# Patient Record
Sex: Female | Born: 1955 | Race: White | Hispanic: No | Marital: Married | Smoking: Current every day smoker
Health system: Southern US, Community
[De-identification: ages and names within clinical notes are randomized; demographics above are authoritative.]

## PROBLEM LIST (undated history)

## (undated) DIAGNOSIS — K219 Gastro-esophageal reflux disease without esophagitis: Secondary | ICD-10-CM

## (undated) DIAGNOSIS — J189 Pneumonia, unspecified organism: Secondary | ICD-10-CM

## (undated) DIAGNOSIS — E119 Type 2 diabetes mellitus without complications: Secondary | ICD-10-CM

## (undated) DIAGNOSIS — C801 Malignant (primary) neoplasm, unspecified: Secondary | ICD-10-CM

## (undated) DIAGNOSIS — T8859XA Other complications of anesthesia, initial encounter: Secondary | ICD-10-CM

## (undated) DIAGNOSIS — G709 Myoneural disorder, unspecified: Secondary | ICD-10-CM

## (undated) DIAGNOSIS — N189 Chronic kidney disease, unspecified: Secondary | ICD-10-CM

## (undated) DIAGNOSIS — M1990 Unspecified osteoarthritis, unspecified site: Secondary | ICD-10-CM

## (undated) DIAGNOSIS — I10 Essential (primary) hypertension: Secondary | ICD-10-CM

## (undated) DIAGNOSIS — Z87442 Personal history of urinary calculi: Secondary | ICD-10-CM

## (undated) DIAGNOSIS — R112 Nausea with vomiting, unspecified: Secondary | ICD-10-CM

## (undated) DIAGNOSIS — Z9889 Other specified postprocedural states: Secondary | ICD-10-CM

## (undated) HISTORY — PX: ABDOMINAL HYSTERECTOMY: SHX81

## (undated) HISTORY — PX: EYE SURGERY: SHX253

---

## 2008-03-22 HISTORY — PX: BREAST SURGERY: SHX581

## 2015-11-20 DIAGNOSIS — M189 Osteoarthritis of first carpometacarpal joint, unspecified: Secondary | ICD-10-CM | POA: Insufficient documentation

## 2015-11-20 DIAGNOSIS — L409 Psoriasis, unspecified: Secondary | ICD-10-CM | POA: Insufficient documentation

## 2015-11-20 DIAGNOSIS — M25441 Effusion, right hand: Secondary | ICD-10-CM | POA: Insufficient documentation

## 2016-11-03 DIAGNOSIS — Z79899 Other long term (current) drug therapy: Secondary | ICD-10-CM | POA: Insufficient documentation

## 2016-11-03 DIAGNOSIS — L4050 Arthropathic psoriasis, unspecified: Secondary | ICD-10-CM | POA: Insufficient documentation

## 2017-03-29 ENCOUNTER — Inpatient Hospital Stay: Payer: BC Managed Care – PPO

## 2017-03-29 ENCOUNTER — Inpatient Hospital Stay
Admission: EM | Admit: 2017-03-29 | Discharge: 2017-03-31 | DRG: 419 | Disposition: A | Discharge status: Home / Self Care | Payer: BC Managed Care – PPO | Attending: General Surgery | Admitting: General Surgery

## 2017-03-29 ENCOUNTER — Encounter: Payer: Self-pay | Admitting: Emergency Medicine

## 2017-03-29 ENCOUNTER — Other Ambulatory Visit: Payer: Self-pay

## 2017-03-29 ENCOUNTER — Emergency Department: Payer: BC Managed Care – PPO

## 2017-03-29 DIAGNOSIS — I10 Essential (primary) hypertension: Secondary | ICD-10-CM | POA: Diagnosis present

## 2017-03-29 DIAGNOSIS — Z79899 Other long term (current) drug therapy: Secondary | ICD-10-CM | POA: Diagnosis not present

## 2017-03-29 DIAGNOSIS — Z833 Family history of diabetes mellitus: Secondary | ICD-10-CM

## 2017-03-29 DIAGNOSIS — K8000 Calculus of gallbladder with acute cholecystitis without obstruction: Secondary | ICD-10-CM | POA: Diagnosis present

## 2017-03-29 DIAGNOSIS — E119 Type 2 diabetes mellitus without complications: Secondary | ICD-10-CM | POA: Diagnosis present

## 2017-03-29 DIAGNOSIS — K819 Cholecystitis, unspecified: Secondary | ICD-10-CM

## 2017-03-29 DIAGNOSIS — Z7984 Long term (current) use of oral hypoglycemic drugs: Secondary | ICD-10-CM | POA: Diagnosis not present

## 2017-03-29 HISTORY — DX: Type 2 diabetes mellitus without complications: E11.9

## 2017-03-29 HISTORY — DX: Essential (primary) hypertension: I10

## 2017-03-29 LAB — BASIC METABOLIC PANEL
ANION GAP: 11 (ref 5–15)
BUN: 12 mg/dL (ref 6–20)
CHLORIDE: 96 mmol/L — AB (ref 101–111)
CO2: 29 mmol/L (ref 22–32)
CREATININE: 0.7 mg/dL (ref 0.44–1.00)
Calcium: 9.5 mg/dL (ref 8.9–10.3)
GFR calc non Af Amer: >60 mL/min (ref >60)
Glucose, Bld: 224 mg/dL — ABNORMAL HIGH (ref 65–99)
Potassium: 3.1 mmol/L — ABNORMAL LOW (ref 3.5–5.1)
SODIUM: 136 mmol/L (ref 135–145)

## 2017-03-29 LAB — CBC
HCT: 40.8 % (ref 35.0–47.0)
HCT: 42.7 % (ref 35.0–47.0)
HEMOGLOBIN: 14.2 g/dL (ref 12.0–16.0)
Hemoglobin: 14.6 g/dL (ref 12.0–16.0)
MCH: 32.7 pg (ref 26.0–34.0)
MCH: 32.5 pg (ref 26.0–34.0)
MCHC: 34.8 g/dL (ref 32.0–36.0)
MCHC: 34.2 g/dL (ref 32.0–36.0)
MCV: 93.8 fL (ref 80.0–100.0)
MCV: 95.1 fL (ref 80.0–100.0)
PLATELETS: 227 10*3/uL (ref 150–440)
PLATELETS: 168 10*3/uL (ref 150–440)
RBC: 4.35 MIL/uL (ref 3.80–5.20)
RBC: 4.49 MIL/uL (ref 3.80–5.20)
RDW: 13.4 % (ref 11.5–14.5)
RDW: 13.8 % (ref 11.5–14.5)
WBC: 12.5 10*3/uL — AB (ref 3.6–11.0)
WBC: 15.8 10*3/uL — ABNORMAL HIGH (ref 3.6–11.0)

## 2017-03-29 LAB — CREATININE, SERUM
Creatinine, Ser: 0.55 mg/dL (ref 0.44–1.00)
GFR calc Af Amer: >60 mL/min (ref >60)
GFR calc non Af Amer: >60 mL/min (ref >60)

## 2017-03-29 LAB — HEPATIC FUNCTION PANEL
ALBUMIN: 3.7 g/dL (ref 3.5–5.0)
ALT: 32 U/L (ref 14–54)
AST: 31 U/L (ref 15–41)
Alkaline Phosphatase: 98 U/L (ref 38–126)
BILIRUBIN TOTAL: 0.6 mg/dL (ref 0.3–1.2)
Bilirubin, Direct: <0.1 mg/dL — ABNORMAL LOW (ref 0.1–0.5)
Total Protein: 6.4 g/dL — ABNORMAL LOW (ref 6.5–8.1)

## 2017-03-29 LAB — SURGICAL PCR SCREEN
MRSA, PCR: NEGATIVE
STAPHYLOCOCCUS AUREUS: NEGATIVE

## 2017-03-29 LAB — LIPASE, BLOOD: Lipase: 32 U/L (ref 11–51)

## 2017-03-29 LAB — TROPONIN I

## 2017-03-29 MED ORDER — GADOBENATE DIMEGLUMINE 529 MG/ML IV SOLN
15.0000 mL | Freq: Once | INTRAVENOUS | Status: AC | PRN
  Administered 2017-03-29: 14 mL via INTRAVENOUS

## 2017-03-29 MED ORDER — ONDANSETRON 4 MG PO TBDP: 4.0000 mg | ORAL_TABLET | Freq: Four times a day (QID) | ORAL | Status: DC | PRN

## 2017-03-29 MED ORDER — NICOTINE 14 MG/24HR TD PT24
14.0000 mg | MEDICATED_PATCH | Freq: Every day | TRANSDERMAL | Status: DC
  Filled 2017-03-29 (×2): qty 1

## 2017-03-29 MED ORDER — LIDOCAINE HCL (CARDIAC) 20 MG/ML IV SOLN: 1.5000 mg/kg | INTRAVENOUS | Status: DC

## 2017-03-29 MED ORDER — DIPHENHYDRAMINE HCL 50 MG/ML IJ SOLN: 12.5000 mg | Freq: Four times a day (QID) | INTRAMUSCULAR | Status: DC | PRN

## 2017-03-29 MED ORDER — MORPHINE SULFATE (PF) 4 MG/ML IV SOLN
4.0000 mg | Freq: Once | INTRAVENOUS | Status: AC
  Administered 2017-03-29: 4 mg via INTRAVENOUS
  Filled 2017-03-29: qty 1

## 2017-03-29 MED ORDER — HYDRALAZINE HCL 20 MG/ML IJ SOLN: 10.0000 mg | INTRAMUSCULAR | Status: DC | PRN

## 2017-03-29 MED ORDER — LACTATED RINGERS IV SOLN
INTRAVENOUS | Status: DC
  Administered 2017-03-29 – 2017-03-30 (×4): via INTRAVENOUS

## 2017-03-29 MED ORDER — PANTOPRAZOLE SODIUM 40 MG IV SOLR
40.0000 mg | Freq: Every day | INTRAVENOUS | Status: DC
  Administered 2017-03-29 – 2017-03-30 (×2): 40 mg via INTRAVENOUS
  Filled 2017-03-29 (×2): qty 40

## 2017-03-29 MED ORDER — MORPHINE SULFATE (PF) 4 MG/ML IV SOLN
4.0000 mg | INTRAVENOUS | Status: DC | PRN
  Administered 2017-03-29 – 2017-03-30 (×4): 4 mg via INTRAVENOUS
  Filled 2017-03-29 (×4): qty 1

## 2017-03-29 MED ORDER — ONDANSETRON HCL 4 MG/2ML IJ SOLN
4.0000 mg | INTRAMUSCULAR | Status: AC
  Administered 2017-03-29: 4 mg via INTRAVENOUS
  Filled 2017-03-29: qty 2

## 2017-03-29 MED ORDER — DIPHENHYDRAMINE HCL 12.5 MG/5ML PO ELIX
12.5000 mg | ORAL_SOLUTION | Freq: Four times a day (QID) | ORAL | Status: DC | PRN
  Filled 2017-03-29: qty 5

## 2017-03-29 MED ORDER — DEXTROSE 5 % IV SOLN
2.0000 g | INTRAVENOUS | Status: DC
  Administered 2017-03-29 – 2017-03-30 (×2): 2 g via INTRAVENOUS
  Filled 2017-03-29 (×3): qty 2

## 2017-03-29 MED ORDER — ONDANSETRON HCL 4 MG/2ML IJ SOLN: 4.0000 mg | Freq: Four times a day (QID) | INTRAMUSCULAR | Status: DC | PRN

## 2017-03-29 MED ORDER — ENOXAPARIN SODIUM 40 MG/0.4ML ~~LOC~~ SOLN
40.0000 mg | SUBCUTANEOUS | Status: DC
  Administered 2017-03-31: 40 mg via SUBCUTANEOUS
  Filled 2017-03-29: qty 0.4

## 2017-03-29 NOTE — ED Triage Notes (Signed)
Pt c/o central chest pain that radiates into her back, starting on Monday and has worsened throughout the night. Pt has nausea but denies emesis and SOB.

## 2017-03-29 NOTE — ED Provider Notes (Addendum)
[facility] Emergency Department Provider Note  ____________________________________________   First MD Initiated Contact with Patient 03/29/17 [phone]     (approximate)  I have reviewed the triage vital signs and the nursing notes.   HISTORY  Chief Complaint Chest Pain    HPI Valoria Tamburri is a 62 y.o. female with a history of hypertension, diabetes, and tobacco use who presents for evaluation of chest versus upper abdominal pain.  She reports that the pain started in the afternoon and has been constant and gradually gotten worse.  Nothing in particular makes it better or worse.  She describes it as a strong aching pain that goes from the top of her abdomen through to her back at times.  Also seems to be present in the right upper part of her abdomen.  She has had some nausea but no vomiting and no diarrhea.  She has had similar symptoms that are much more mild at rest that she attributed to acid reflux.  She has no cardiac history.  She denies upper/substernal chest pain and denies shortness of breath.  She has had no recent fever or chills.  She also denies dysuria.  Past Medical History:  Diagnosis Date  . Diabetes mellitus without complication ([place])   . Hypertension     There are no active problems to display for this patient.   History reviewed. No pertinent surgical history.  Prior to Admission medications   Not on File    Allergies Patient has no known allergies.  History reviewed. No pertinent family history.  Social History Social History   Tobacco Use  . Smoking status: Current Every Day Smoker  . Smokeless tobacco: Never Used  Substance Use Topics  . Alcohol use: Not on file  . Drug use: Not on file    Review of Systems Constitutional: No fever/chills ENT: No sore throat. Cardiovascular: Lower chest versus upper abdominal pain Respiratory: Denies shortness of breath. Gastrointestinal: Upper/epigastric abdominal pain.  Nausea, no  vomiting.  No diarrhea.  No constipation. Genitourinary: Negative for dysuria. Musculoskeletal: Negative for neck pain.  Back pain radiating from the front Integumentary: Negative for rash. Neurological: Negative for headaches, focal weakness or numbness. Psych:  no psychiatric complaints  ____________________________________________   PHYSICAL EXAM:  VITAL SIGNS: ED Triage Vitals  Enc Vitals Group     BP 03/29/17 0157 130/76     Pulse Rate 03/29/17 0157 67     Resp 03/29/17 0157 16     Temp 03/29/17 0157 97.7 F (36.5 C)     Temp Source 03/29/17 0157 Oral     SpO2 03/29/17 0157 97 %     Weight 03/29/17 0155 71.2 kg (157 lb)     Height 03/29/17 0155 1.6 m (5\' 3" )     Head Circumference --      Peak Flow --      Pain Score 03/29/17 0411 8     Pain Loc --      Pain Edu? --      Excl. in [place]? --     Constitutional: Alert and oriented. Well appearing and in no acute distress. Eyes: Conjunctivae are normal.  Head: Atraumatic. Nose: No congestion/rhinnorhea. Mouth/Throat: Mucous membranes are moist. Neck: No stridor.  No meningeal signs.   Cardiovascular: Normal rate, regular rhythm. Good peripheral circulation. Grossly normal heart sounds.  No reproducible chest wall tenderness Respiratory: Normal respiratory effort.  No retractions. Lungs CTAB. Gastrointestinal: Soft with moderate tenderness to palpation of the epigastrium and  right upper quadrant with positive Murphy sign.  No lower abdominal tenderness. Musculoskeletal: No lower extremity tenderness nor edema. No gross deformities of extremities. Neurologic:  Normal speech and language. No gross focal neurologic deficits are appreciated.  Skin:  Skin is warm, dry and intact. No rash noted. Psychiatric: Mood and affect are normal. Speech and behavior are normal.  ____________________________________________   LABS (all labs ordered are listed, but only abnormal results are displayed)  Labs Reviewed  BASIC METABOLIC  PANEL - Abnormal; Notable for the following components:      Result Value   Potassium 3.1 (*)    Chloride 96 (*)    Glucose, Bld 224 (*)    All other components within normal limits  CBC - Abnormal; Notable for the following components:   WBC 12.5 (*)    All other components within normal limits  HEPATIC FUNCTION PANEL - Abnormal; Notable for the following components:   Total Protein 6.4 (*)    Bilirubin, Direct <0.1 (*)    All other components within normal limits  TROPONIN I  LIPASE, BLOOD   ____________________________________________  EKG  ED ECG REPORT I, Hinda Kehr, the attending physician, personally viewed and interpreted this ECG.  Date: 03/29/2017 EKG Time: 1:52 AM Rate: 69 Rhythm: normal sinus rhythm QRS Axis: normal Intervals: normal ST/T Wave abnormalities: Non-specific ST segment / T-wave changes, but no evidence of acute ischemia. Narrative Interpretation: no evidence of acute ischemia  ____________________________________________  RADIOLOGY   Dg Chest 2 View  Result Date: 03/29/2017 CLINICAL DATA:  Central chest pain. EXAM: CHEST  2 VIEW COMPARISON:  None. FINDINGS: Lung volumes are low. The cardiomediastinal contours are normal. Mild central bronchial thickening. Pulmonary vasculature is normal. No consolidation, pleural effusion, or pneumothorax. No acute osseous abnormalities are seen. IMPRESSION: Low lung volumes with central bronchial thickening. Electronically Signed   By: Jeb Levering M.D.   On: 03/29/2017 03:48   US Abdomen Limited Ruq  Result Date: 03/29/2017 CLINICAL DATA:  Epigastric and right upper quadrant pain radiating to the back since yesterday. EXAM: ULTRASOUND ABDOMEN LIMITED RIGHT UPPER QUADRANT COMPARISON:  None in PACs FINDINGS: Gallbladder: The gallbladder is adequately distended. Multiple small stones as well as sludge are observed. The largest stone measures just under 6 mm in diameter. There is gallbladder wall thickening and a  small amount of pericholecystic fluid. There is no positive sonographic Murphy's sign. Common bile duct: Diameter: 6.4 mm Liver: The hepatic echotexture is mildly increased. There are multiple septated cystic appearing structures in the left hepatic lobe. The largest of these measures 6.6 x 7.4 x 7.1 cm. No intrahepatic ductal dilation is observed. Portal vein is patent on color Doppler imaging with normal direction of blood flow towards the liver. Incidental note is made of a complex lower pole right renal cyst measuring 3 x 3.4 x 2.7 cm. IMPRESSION: Gallstones and sludge with changes that is suggest acute or subacute cholecystitis. No positive sonographic Murphy's sign however. Complex appearing left lobe cystic structures in the liver as well as a complex lower pole cyst in the right kidney. When the patient can tolerate the procedure, further evaluation of the structures with MRI is recommended. Electronically Signed   By: David  Martinique M.D.   On: 03/29/2017 07:24    ____________________________________________   PROCEDURES  Critical Care performed: No   Procedure(s) performed:   Procedures   ____________________________________________   INITIAL IMPRESSION / ASSESSMENT AND PLAN / ED COURSE  As part of my medical decision making,  I reviewed the following data within the [facility] notes reviewed and incorporated, Labs reviewed  and Radiograph reviewed     Differential diagnosis includes, but is not limited to, biliary colic/gallbladder disease, ACS, aortic dissection, pulmonary embolism, cardiac tamponade, pneumothorax, pneumonia, pericarditis, myocarditis, GI-related causes including esophagitis/gastritis.  However, the patient's presentation seems most consistent with biliary colic.  I do not believe she is having chest pain (cardiac or otherwise); she initially described it as her chest but she clearly indicates epigastric discomfort and the symptoms she is  describing strongly suggest gallbladder disease.    She is tender to palpation of the epigastrium and right upper quadrant with a positive Murphy sign.  Labs are notable for a mild leukocytosis but otherwise unremarkable, but lipase and hepatic function panel are pending.  She does have multiple risk factors for cardiac disease but she is still low risk based on HEART score.  I discussed all this with her.  Obtaining ultrasound of RUQ.  If she has a completely normal ultrasound, she may benefit from a repeat troponin, but otherwise I have minimal concern for her from a cardiac perspective.  This is much more likely to be GI-related.  Patient and husband understand and agree with the plan.     Clinical Course as of Mar 30 735  Tue Mar 29, 2017  0612 Reassuring hepatic function panel and lipase  [CF]  0736 Ultrasound is consistent with probable cholecystitis.  Patient's pain is better at this time but still tender to palpation.  I called and spoke by phone with Dr. Adonis Huguenin who will come to the emergency department and evaluate the patient in person. US ABDOMEN LIMITED RUQ [CF]    Clinical Course User Index [CF] Hinda Kehr, MD    ____________________________________________  FINAL CLINICAL IMPRESSION(S) / ED DIAGNOSES  Final diagnoses:  Cholecystitis     MEDICATIONS GIVEN DURING THIS VISIT:  Medications  morphine 4 MG/ML injection 4 mg (4 mg Intravenous Given 03/29/17 0523)  ondansetron (ZOFRAN) injection 4 mg (4 mg Intravenous Given 03/29/17 0522)     ED Discharge Orders    None       Note:  This document was prepared using Dragon voice recognition software and may include unintentional dictation errors.    Hinda Kehr, MD 03/29/17 [id number]    Hinda Kehr, MD 03/29/17 [phone]

## 2017-03-29 NOTE — H&P (Signed)
Patient ID: Erica Cunningham, female   DOB: 07-10-1955, 62 y.o.   MRN: [id number]  CC: Abdominal pain  HPI Erica Cunningham is a 62 y.o. female who presents the emergency department with a 1 day history of abdominal pain.  Patient reports that the pain started yesterday afternoon at about 4 PM.  It progressively worsened overnight which prompted her to come to the emergency department.  She states she has had waves of midepigastric to right upper quadrant pain.  It was worsened after she attempted to eat something yesterday evening.  She has had some milder symptoms similar to this in the past but nothing this severe.  She denies any fevers, chills, chest pain, shortness of breath.  She also had a bowel movement yesterday evening that was loose per normal.  She denies any dysuria.  She is otherwise in her usual state of health and denies any recent sick contacts or travels.  HPI  Past Medical History:  Diagnosis Date  . Diabetes mellitus without complication ([place])   . Hypertension     Past surgical history: No surgery for skin cancer.  Family history: No known family history of cancer, diabetes, heart disease.  Social History Social History   Tobacco Use  . Smoking status: Current Every Day Smoker  . Smokeless tobacco: Never Used  Substance Use Topics  . Alcohol use: Not on file  . Drug use: Not on file    No Known Allergies  Outpatient medications:  Metformin: 2000 mg by mouth with dinner Lipitor: 40 mg daily Diltiazem: 120 mg daily Losartan: 100 mg daily Chlorthalidone: 100 mg daily Januvia: 100 mg daily Albuterol: 90 mg inhaled as needed Flovent: 110 mg inhaled as needed Methotrexate: 12.5 mg once a week   Review of Systems A multi-point review of systems was asked and was negative except for the findings documented in the HPI  Physical Exam Blood pressure 132/81, pulse 99, temperature 97.7 F (36.5 C), temperature source Oral, resp. rate 18, height 5\' 3"  (1.6 m), weight 71.2  kg (157 lb), SpO2 96 %. CONSTITUTIONAL: Resting in bed no acute distress. EYES: Pupils are equal, round, and reactive to light, Sclera are non-icteric. EARS, NOSE, MOUTH AND THROAT: The oropharynx is clear. The oral mucosa is pink and moist. Hearing is intact to voice. LYMPH NODES:  Lymph nodes in the neck are normal. RESPIRATORY:  Lungs are clear. There is normal respiratory effort, with equal breath sounds bilaterally, and without pathologic use of accessory muscles. CARDIOVASCULAR: Heart is regular without murmurs, gallops, or rubs. GI: The abdomen is soft, mildly tender to deep palpation in the right upper quadrant but with a negative Murphy sign, and nondistended. There are no palpable masses. There is no hepatosplenomegaly. There are normal bowel sounds in all quadrants. GU: Rectal deferred.   MUSCULOSKELETAL: Normal muscle strength and tone. No cyanosis or edema.   SKIN: Turgor is good and there are no pathologic skin lesions or ulcers. NEUROLOGIC: Motor and sensation is grossly normal. Cranial nerves are grossly intact. PSYCH:  Oriented to person, place and time. Affect is normal.  Data Reviewed Images and labs reviewed which showed a mild leukocytosis of 12.5 and a elevated glucose of 224, hypokalemia 3.1, hypochloremia of 96.  The remainder of her labs are within normal limits.  Ultrasound of the right upper quadrant does show multiple gallstones as well as a mild gallbladder wall thickening with some pericholecystic fluid.  There is no evidence of sonographic Murphy sign in the common bile  duct was normal.  There were also numerous complex cystic structures within the liver and kidney seen on the ultrasound. I have personally reviewed the patient's imaging, laboratory findings and medical records.    Assessment    Acute versus subacute cholecystitis and cystic structures of the liver and kidney.    Plan    62 year old female with likely acute cholecystitis.  Discussed the  diagnosis in detail with the patient and her husband to include the treatment of a laparoscopic cholecystectomy.  Discussed however given that there were numerous complex cystic structure seen on the ultrasound though should be imaged prior to proceeding with an operation.  We will admit to inpatient and start on IV antibiotics.  An order for an MRI of the abdomen was placed to better visualize the cystic structures per the recommendation of radiology.  Discussed that we would decide on operative plan after further images with the patient.  Patient voiced understanding and agrees with this plan.  Plan for admission, n.p.o., IV antibiotics, MRI of the abdomen, likely laparoscopic cholecystectomy later today versus tomorrow.     Time spent with the patient was 50 minutes, with more than 50% of the time spent in face-to-face education, counseling and care coordination.     Clayburn Pert, MD FACS General Surgeon 03/29/2017, 8:36 AM

## 2017-03-29 NOTE — ED Notes (Signed)
General Surgery to bedside at this time.   

## 2017-03-30 ENCOUNTER — Encounter: Payer: Self-pay | Admitting: *Deleted

## 2017-03-30 ENCOUNTER — Inpatient Hospital Stay: Payer: BC Managed Care – PPO | Admitting: Registered Nurse

## 2017-03-30 ENCOUNTER — Encounter
Admission: EM | Disposition: A | Discharge status: Home / Self Care | Payer: Self-pay | Source: Home / Self Care | Attending: General Surgery

## 2017-03-30 HISTORY — PX: CHOLECYSTECTOMY: SHX55

## 2017-03-30 LAB — COMPREHENSIVE METABOLIC PANEL
ALT: 26 U/L (ref 14–54)
AST: 19 U/L (ref 15–41)
Albumin: 3.1 g/dL — ABNORMAL LOW (ref 3.5–5.0)
Alkaline Phosphatase: 70 U/L (ref 38–126)
Anion gap: 9 (ref 5–15)
BUN: 7 mg/dL (ref 6–20)
CHLORIDE: 96 mmol/L — AB (ref 101–111)
CO2: 32 mmol/L (ref 22–32)
CREATININE: 0.68 mg/dL (ref 0.44–1.00)
Calcium: 8.6 mg/dL — ABNORMAL LOW (ref 8.9–10.3)
Glucose, Bld: 165 mg/dL — ABNORMAL HIGH (ref 65–99)
POTASSIUM: 2.9 mmol/L — AB (ref 3.5–5.1)
SODIUM: 137 mmol/L (ref 135–145)
Total Bilirubin: 1 mg/dL (ref 0.3–1.2)
Total Protein: 6.1 g/dL — ABNORMAL LOW (ref 6.5–8.1)

## 2017-03-30 LAB — GLUCOSE, CAPILLARY
GLUCOSE-CAPILLARY: 161 mg/dL — AB (ref 65–99)
Glucose-Capillary: 156 mg/dL — ABNORMAL HIGH (ref 65–99)

## 2017-03-30 LAB — CBC
HCT: 38.8 % (ref 35.0–47.0)
Hemoglobin: 13.3 g/dL (ref 12.0–16.0)
MCH: 32.5 pg (ref 26.0–34.0)
MCHC: 34.2 g/dL (ref 32.0–36.0)
MCV: 95 fL (ref 80.0–100.0)
PLATELETS: 176 10*3/uL (ref 150–440)
RBC: 4.08 MIL/uL (ref 3.80–5.20)
RDW: 13.6 % (ref 11.5–14.5)
WBC: 11.6 10*3/uL — AB (ref 3.6–11.0)

## 2017-03-30 LAB — POTASSIUM
POTASSIUM: 2.8 mmol/L — AB (ref 3.5–5.1)
Potassium: 3.1 mmol/L — ABNORMAL LOW (ref 3.5–5.1)

## 2017-03-30 LAB — PHOSPHORUS: PHOSPHORUS: 3.3 mg/dL (ref 2.5–4.6)

## 2017-03-30 LAB — MAGNESIUM: MAGNESIUM: 1.4 mg/dL — AB (ref 1.7–2.4)

## 2017-03-30 SURGERY — LAPAROSCOPIC CHOLECYSTECTOMY
Anesthesia: General | Wound class: Clean Contaminated

## 2017-03-30 MED ORDER — PROPOFOL 10 MG/ML IV BOLUS
INTRAVENOUS | Status: AC
  Filled 2017-03-30: qty 20

## 2017-03-30 MED ORDER — ONDANSETRON HCL 4 MG/2ML IJ SOLN
INTRAMUSCULAR | Status: AC
  Filled 2017-03-30: qty 2

## 2017-03-30 MED ORDER — GLYCOPYRROLATE 0.2 MG/ML IJ SOLN
INTRAMUSCULAR | Status: DC | PRN
  Administered 2017-03-30: 0.2 mg via INTRAVENOUS

## 2017-03-30 MED ORDER — SODIUM CHLORIDE 0.9 % IV SOLN
INTRAVENOUS | Status: DC
  Administered 2017-03-30 (×2): via INTRAVENOUS

## 2017-03-30 MED ORDER — GLYCOPYRROLATE 0.2 MG/ML IJ SOLN
INTRAMUSCULAR | Status: AC
  Filled 2017-03-30: qty 1

## 2017-03-30 MED ORDER — FENTANYL CITRATE (PF) 100 MCG/2ML IJ SOLN
INTRAMUSCULAR | Status: AC
  Administered 2017-03-30: 25 ug via INTRAVENOUS
  Filled 2017-03-30: qty 2

## 2017-03-30 MED ORDER — FENTANYL CITRATE (PF) 100 MCG/2ML IJ SOLN
25.0000 ug | INTRAMUSCULAR | Status: DC | PRN
  Administered 2017-03-30 (×2): 25 ug via INTRAVENOUS

## 2017-03-30 MED ORDER — OXYCODONE-ACETAMINOPHEN 5-325 MG PO TABS
1.0000 | ORAL_TABLET | ORAL | Status: DC | PRN
Start: 2017-03-30 — End: 2017-03-31
  Administered 2017-03-30 – 2017-03-31 (×2): 2 via ORAL
  Filled 2017-03-30 (×2): qty 2

## 2017-03-30 MED ORDER — MIDAZOLAM HCL 2 MG/2ML IJ SOLN
INTRAMUSCULAR | Status: AC
  Filled 2017-03-30: qty 2

## 2017-03-30 MED ORDER — FENTANYL CITRATE (PF) 100 MCG/2ML IJ SOLN
INTRAMUSCULAR | Status: AC
  Filled 2017-03-30: qty 2

## 2017-03-30 MED ORDER — DEXAMETHASONE SODIUM PHOSPHATE 10 MG/ML IJ SOLN
INTRAMUSCULAR | Status: DC | PRN
  Administered 2017-03-30: 10 mg via INTRAVENOUS

## 2017-03-30 MED ORDER — LIDOCAINE HCL (PF) 1 % IJ SOLN
INTRAMUSCULAR | Status: AC
  Filled 2017-03-30: qty 30

## 2017-03-30 MED ORDER — LIDOCAINE HCL (CARDIAC) 20 MG/ML IV SOLN
INTRAVENOUS | Status: DC | PRN
  Administered 2017-03-30: 50 mg via INTRAVENOUS

## 2017-03-30 MED ORDER — ONDANSETRON HCL 4 MG/2ML IJ SOLN
INTRAMUSCULAR | Status: DC | PRN
  Administered 2017-03-30: 4 mg via INTRAVENOUS

## 2017-03-30 MED ORDER — DEXAMETHASONE SODIUM PHOSPHATE 10 MG/ML IJ SOLN
INTRAMUSCULAR | Status: AC
  Filled 2017-03-30: qty 1

## 2017-03-30 MED ORDER — ACETAMINOPHEN 10 MG/ML IV SOLN
INTRAVENOUS | Status: DC | PRN
  Administered 2017-03-30: 1000 mg via INTRAVENOUS

## 2017-03-30 MED ORDER — SUGAMMADEX SODIUM 200 MG/2ML IV SOLN
INTRAVENOUS | Status: DC | PRN
  Administered 2017-03-30: 150 mg via INTRAVENOUS

## 2017-03-30 MED ORDER — LIDOCAINE HCL (PF) 2 % IJ SOLN
INTRAMUSCULAR | Status: AC
  Filled 2017-03-30: qty 10

## 2017-03-30 MED ORDER — FENTANYL CITRATE (PF) 100 MCG/2ML IJ SOLN
INTRAMUSCULAR | Status: DC | PRN
  Administered 2017-03-30 (×4): 50 ug via INTRAVENOUS

## 2017-03-30 MED ORDER — ONDANSETRON HCL 4 MG/2ML IJ SOLN: 4.0000 mg | Freq: Once | INTRAMUSCULAR | Status: DC | PRN

## 2017-03-30 MED ORDER — SUCCINYLCHOLINE CHLORIDE 20 MG/ML IJ SOLN
INTRAMUSCULAR | Status: DC | PRN
  Administered 2017-03-30: 100 mg via INTRAVENOUS

## 2017-03-30 MED ORDER — ROCURONIUM BROMIDE 50 MG/5ML IV SOLN
INTRAVENOUS | Status: AC
  Filled 2017-03-30: qty 1

## 2017-03-30 MED ORDER — POTASSIUM CHLORIDE 10 MEQ/100ML IV SOLN
10.0000 meq | INTRAVENOUS | Status: AC
  Administered 2017-03-30 (×4): 10 meq via INTRAVENOUS
  Filled 2017-03-30 (×4): qty 100

## 2017-03-30 MED ORDER — LIDOCAINE HCL 1 % IJ SOLN
INTRAMUSCULAR | Status: DC | PRN
  Administered 2017-03-30: 20 mL

## 2017-03-30 MED ORDER — PROPOFOL 10 MG/ML IV BOLUS
INTRAVENOUS | Status: DC | PRN
  Administered 2017-03-30: 150 mg via INTRAVENOUS
  Administered 2017-03-30: 20 mg via INTRAVENOUS

## 2017-03-30 MED ORDER — MIDAZOLAM HCL 2 MG/2ML IJ SOLN
INTRAMUSCULAR | Status: DC | PRN
  Administered 2017-03-30: 2 mg via INTRAVENOUS

## 2017-03-30 MED ORDER — BUPIVACAINE HCL (PF) 0.5 % IJ SOLN
INTRAMUSCULAR | Status: AC
Start: 2017-03-30 — End: 2017-03-30
  Filled 2017-03-30: qty 30

## 2017-03-30 MED ORDER — ACETAMINOPHEN 10 MG/ML IV SOLN
INTRAVENOUS | Status: AC
  Filled 2017-03-30: qty 100

## 2017-03-30 MED ORDER — ROCURONIUM BROMIDE 100 MG/10ML IV SOLN
INTRAVENOUS | Status: DC | PRN
  Administered 2017-03-30: 40 mg via INTRAVENOUS
  Administered 2017-03-30: 10 mg via INTRAVENOUS

## 2017-03-30 MED ORDER — SUGAMMADEX SODIUM 200 MG/2ML IV SOLN
INTRAVENOUS | Status: AC
  Filled 2017-03-30: qty 2

## 2017-03-30 SURGICAL SUPPLY — 49 items
ADHESIVE MASTISOL STRL (MISCELLANEOUS) ×3 IMPLANT
APPLIER CLIP ROT 10 11.4 M/L (STAPLE) ×3
BLADE SURG SZ11 CARB STEEL (BLADE) ×3 IMPLANT
BULB RESERV EVAC DRAIN JP 100C (MISCELLANEOUS) ×3 IMPLANT
CANISTER SUCT 1200ML W/VALVE (MISCELLANEOUS) ×3 IMPLANT
CATH CHOLANG 76X19 KUMAR (CATHETERS) IMPLANT
CHLORAPREP W/TINT 26ML (MISCELLANEOUS) ×3 IMPLANT
CLIP APPLIE ROT 10 11.4 M/L (STAPLE) ×1 IMPLANT
CLOSURE WOUND 1/2 X4 (GAUZE/BANDAGES/DRESSINGS)
CONRAY 60ML FOR OR (MISCELLANEOUS) IMPLANT
DECANTER SPIKE VIAL GLASS SM (MISCELLANEOUS) ×6 IMPLANT
DRAIN CHANNEL JP 19F (MISCELLANEOUS) ×3 IMPLANT
DRAPE SHEET LG 3/4 BI-LAMINATE (DRAPES) ×3 IMPLANT
DRSG TEGADERM 2-3/8X2-3/4 SM (GAUZE/BANDAGES/DRESSINGS) ×12 IMPLANT
DRSG TELFA 4X3 1S NADH ST (GAUZE/BANDAGES/DRESSINGS) ×3 IMPLANT
ELECT REM PT RETURN 9FT ADLT (ELECTROSURGICAL) ×3
ELECTRODE REM PT RTRN 9FT ADLT (ELECTROSURGICAL) ×1 IMPLANT
GLOVE BIO SURGEON STRL SZ7.5 (GLOVE) ×9 IMPLANT
GLOVE INDICATOR 6.5 STRL GRN (GLOVE) ×3 IMPLANT
GLOVE INDICATOR 8.0 STRL GRN (GLOVE) ×9 IMPLANT
GOWN STRL REUS W/ TWL LRG LVL3 (GOWN DISPOSABLE) ×3 IMPLANT
GOWN STRL REUS W/TWL LRG LVL3 (GOWN DISPOSABLE) ×6
GRASPER SUT TROCAR 14GX15 (MISCELLANEOUS) IMPLANT
IRRIGATION STRYKERFLOW (MISCELLANEOUS) ×1 IMPLANT
IRRIGATOR STRYKERFLOW (MISCELLANEOUS) ×3
IV NS 1000ML (IV SOLUTION) ×2
IV NS 1000ML BAXH (IV SOLUTION) ×1 IMPLANT
L-HOOK LAP DISP 36CM (ELECTROSURGICAL) ×3
LABEL OR SOLS (LABEL) ×3 IMPLANT
LHOOK LAP DISP 36CM (ELECTROSURGICAL) ×1 IMPLANT
NEEDLE HYPO 25X1 1.5 SAFETY (NEEDLE) ×3 IMPLANT
NEEDLE VERESS 14GA 120MM (NEEDLE) ×3 IMPLANT
NS IRRIG 500ML POUR BTL (IV SOLUTION) ×3 IMPLANT
PACK LAP CHOLECYSTECTOMY (MISCELLANEOUS) ×3 IMPLANT
PENCIL ELECTRO HAND CTR (MISCELLANEOUS) ×3 IMPLANT
POUCH ENDO CATCH 10MM SPEC (MISCELLANEOUS) ×3 IMPLANT
SCISSORS METZENBAUM CVD 33 (INSTRUMENTS) ×3 IMPLANT
SLEEVE ENDOPATH XCEL 5M (ENDOMECHANICALS) ×6 IMPLANT
SLEEVE PROTECTION STRL DISP (MISCELLANEOUS) ×3 IMPLANT
SPONGE DRAIN TRACH 4X4 STRL 2S (GAUZE/BANDAGES/DRESSINGS) ×3 IMPLANT
STRIP CLOSURE SKIN 1/2X4 (GAUZE/BANDAGES/DRESSINGS) IMPLANT
SUT MNCRL 4-0 (SUTURE) ×2
SUT MNCRL 4-0 27XMFL (SUTURE) ×1
SUT VICRYL 0 AB UR-6 (SUTURE) IMPLANT
SUTURE MNCRL 4-0 27XMF (SUTURE) ×1 IMPLANT
TROCAR ENDOPATH XCEL 12X100 BL (ENDOMECHANICALS) ×3 IMPLANT
TROCAR XCEL 12X100 BLDLESS (ENDOMECHANICALS) ×3 IMPLANT
TROCAR XCEL NON-BLD 5MMX100MML (ENDOMECHANICALS) ×3 IMPLANT
TUBING INSUFFLATION (TUBING) ×3 IMPLANT

## 2017-03-30 NOTE — Brief Op Note (Signed)
03/29/2017 - 03/30/2017  5:17 PM  PATIENT:  Erica Cunningham  62 y.o. female  PRE-OPERATIVE DIAGNOSIS:   Acute cholecystitis  POST-OPERATIVE DIAGNOSIS:  n/a   PROCEDURE:  Procedure(s): LAPAROSCOPIC CHOLECYSTECTOMY (N/A)  SURGEON:  Surgeon(s) and Role:    * Clayburn Pert, MD - Primary  PHYSICIAN ASSISTANT:   ASSISTANTS: PA student  ANESTHESIA:   general  EBL:  100 mL   BLOOD ADMINISTERED:none  DRAINS: (19 [place]) Blake drain(s) in the Perihepatic space   LOCAL MEDICATIONS USED:  MARCAINE   , XYLOCAINE  and Amount: 20 ml  SPECIMEN:  Source of Specimen:  Gallbladder  DISPOSITION OF SPECIMEN:  PATHOLOGY  COUNTS:  YES  TOURNIQUET:  * No tourniquets in log *  DICTATION: .Dragon Dictation  PLAN OF CARE: Return to inpatient status  PATIENT DISPOSITION:  PACU - hemodynamically stable.   Delay start of Pharmacological VTE agent (>24hrs) due to surgical blood loss or risk of bleeding: no

## 2017-03-30 NOTE — Op Note (Signed)
Laparoscopic Cholecystectomy  Pre-operative Diagnosis: Acute cholecystitis  Post-operative Diagnosis: Acute cholecystitis  Procedure: Laparoscopic cholecystectomy  Surgeon: Juanda Crumble T. Adonis Huguenin, MD FACS  Anesthesia: Gen. with endotracheal tube  Assistant: PA student  Procedure Details  The patient was seen again in the Holding Room. The benefits, complications, treatment options, and expected outcomes were discussed with the patient. The risks of bleeding, infection, recurrence of symptoms, failure to resolve symptoms, bile duct damage, bile duct leak, retained common bile duct stone, bowel injury, any of which could require further surgery and/or ERCP, stent, or papillotomy were reviewed with the patient. The likelihood of improving the patient's symptoms with return to their baseline status is good.  The patient and/or family concurred with the proposed plan, giving informed consent.  The patient was taken to Operating Room, identified as [facility] and the procedure verified as Laparoscopic Cholecystectomy.  A Time Out was held and the above information confirmed.  Prior to the induction of general anesthesia, antibiotic prophylaxis was administered. VTE prophylaxis was in place. General endotracheal anesthesia was then administered and tolerated well. After the induction, the abdomen was prepped with Chloraprep and draped in the sterile fashion. The patient was positioned in the supine position.  Local anesthetic  was injected into the skin near the umbilicus and an incision made. The Veress needle was placed. Pneumoperitoneum was then created with CO2 and tolerated well without any adverse changes in the patient's vital signs. A 34mm port was placed in the periumbilical position and the abdominal cavity was explored.  Two 5-mm ports were placed in the right upper quadrant and a 12 mm epigastric port was placed all under direct vision. All skin incisions  were infiltrated with a local anesthetic  agent before making the incision and placing the trocars.   The patient was positioned  in reverse Trendelenburg, tilted slightly to the patient's left.  The gallbladder was identified, however the gallbladder was unable to be grasped due to its tense state.  Using a decompressing needle approximately 60 mL of bilious fluid was extracted to decompress the gallbladder.  After which the fundus grasped and retracted cephalad.  It was noted that the duodenum was adhered to the mid body of the gallbladder and there was evidence of purulence surrounding the gallbladder.  Adhesions were lysed bluntly. The infundibulum was grasped and retracted laterally, exposing the peritoneum overlying the triangle of Calot. This was then divided and exposed in a blunt fashion.  A true critical view was never obtained due to the dense inflammatory adhesions surrounding the gallbladder.  A ductile structure entering into the gallbladder in the middle of these inflammatory adhesions was identified and serially clipped and cut with endoscopic clips.  Similarly a additional artery was identified behind this that was also serially clipped and cut.  It was noted during the takedown of the dense inflammatory rind that the liver was entered into on the medial aspect of the gallbladder wall.  This was made hemostatic with direct electrocautery.  The gallbladder was taken from the gallbladder fossa in a retrograde fashion with the electrocautery.  The posterior wall of the gallbladder was entered into while it was being taken off of the liver bed.  The entire contents of the gallbladder spilled into the abdomen and were removed with the suction irrigator.  2 identified gallstones were also removed with a stone grasper.  The gallbladder was removed and placed in an Endocatch bag. The liver bed was irrigated and inspected. Hemostasis was achieved with the  electrocautery. Copious irrigation was utilized and was repeatedly aspirated until clear.   The gallbladder and Endocatch sac were then removed through the epigastric port site.   Due to the purulent fluid and the dense nature of the inflammation the decision was made to place a drain.  A 19 French round Blake drain was placed in the abdomen through the midline port and removed through the most lateral right upper quadrant port.  Under direct visualization it was placed into the perihepatic space laying adjacent to the visualized clips.  The clips were noted to be hemostatic and without evidence of bile leak at the time the drain was placed.  Inspection of the right upper quadrant was performed. No bleeding, bile duct injury or leak, or bowel injury was noted. Pneumoperitoneum was released.  The epigastric port site was closed with figure-of-eight 0 Vicryl sutures. 4-0 subcuticular Monocryl was used to close the skin.  The drain was secured with a 3-0 nylon suture Steristrips and Mastisol and sterile dressings were  applied.  The patient was then extubated and brought to the recovery room in stable condition. Sponge, lap, and needle counts were correct at closure and at the conclusion of the case.   Findings: Acute cholecystitis   Estimated Blood Loss: 100 mL         Drains: 44 French round Blake         Specimens: Gallbladder           Complications: none               Wadie Liew T. Adonis Huguenin, MD, FACS

## 2017-03-30 NOTE — Progress Notes (Signed)
CC: Abdominal pain Subjective: Patient reports that her abdominal pain is still there but has subsided some.  She denies any fevers or chills.  She is ready to undergo surgery.  Objective: Vital signs in last 24 hours: Temp:  [98.2 F (36.8 C)-99.1 F (37.3 C)] 99.1 F (37.3 C) (01/09 0349) Pulse Rate:  [85-99] 85 (01/09 0349) Resp:  [16-18] 16 (01/09 0349) BP: (116-120)/(63-75) 120/63 (01/09 0349) SpO2:  [92 %-97 %] 92 % (01/09 0349) Last BM Date: 03/28/17  Intake/Output from previous day: 01/08 0701 - 01/09 0700 In: 2876 [P.O.:900; I.V.:1926; IV Piggyback:50] Out: 2050 [Urine:2050] Intake/Output this shift: No intake/output data recorded.  Physical exam:  General: No acute distress chest: Clear to auscultation  heart: Rate and rhythm Abdomen: Soft, nondistended, mildly tender to palpation in the right upper quadrant.  Lab Results: CBC  Recent Labs    03/29/17 0919 03/30/17 0403  WBC 15.8* 11.6*  HGB 14.6 13.3  HCT 42.7 38.8  PLT 168 176   BMET Recent Labs    03/29/17 0154 03/29/17 0919 03/30/17 0403  NA 136  --  137  K 3.1*  --  2.9*  CL 96*  --  96*  CO2 29  --  32  GLUCOSE 224*  --  165*  BUN 12  --  7  CREATININE 0.70 0.55 0.68  CALCIUM 9.5  --  8.6*   PT/INR No results for input(s): LABPROT, INR in the last 72 hours. ABG No results for input(s): PHART, HCO3 in the last 72 hours.  Invalid input(s): PCO2, PO2  Studies/Results: Dg Chest 2 View  Result Date: 03/29/2017 CLINICAL DATA:  Central chest pain. EXAM: CHEST  2 VIEW COMPARISON:  None. FINDINGS: Lung volumes are low. The cardiomediastinal contours are normal. Mild central bronchial thickening. Pulmonary vasculature is normal. No consolidation, pleural effusion, or pneumothorax. No acute osseous abnormalities are seen. IMPRESSION: Low lung volumes with central bronchial thickening. Electronically Signed   By: Jeb Levering M.D.   On: 03/29/2017 03:48   Mr Abdomen W Wo Contrast  Result  Date: 03/29/2017 CLINICAL DATA:  Abdominal pain, abnormal ultrasound EXAM: MRI ABDOMEN WITHOUT AND WITH CONTRAST TECHNIQUE: Multiplanar multisequence MR imaging of the abdomen was performed both before and after the administration of intravenous contrast. CONTRAST:  47mL MULTIHANCE GADOBENATE DIMEGLUMINE 529 MG/ML IV SOLN COMPARISON:  Right upper quadrant ultrasound dated 03/29/2017 FINDINGS: Lower chest: Lung bases are clear. Hepatobiliary: Moderate geographic hepatic steatosis. Scattered hepatic cysts, including a complex/septated 7.6 cm cyst in the left hepatic lobe (series 7/image 14), benign. No suspicious/enhancing hepatic lesions. Tiny layering gallstones are better visualized on ultrasound. Associated mild gallbladder wall thickening/edema (series 3/image 17). Nodularity of the gallbladder fundus (series 2/images 18 and 19), reflecting gallbladder adenomyomatosis. No intrahepatic or extrahepatic ductal dilatation. Common duct measures 8 mm, at the upper limits of normal. No choledocholithiasis is seen. Pancreas:  Within normal limits. Spleen:  Within normal limits. Adrenals/Urinary Tract:  Adrenal glands are within normal limits. 3.0 cm mildly irregular right lower pole renal cyst with suspected thin septation (series 3/image 26), without enhancement following contrast administration, benign (Bosniak II). Left kidney is within normal limits. No hydronephrosis. Stomach/Bowel: Stomach is within normal limits. Visualized bowel is unremarkable. Vascular/Lymphatic:  No evidence of abdominal aortic aneurysm. No suspicious abdominal lymphadenopathy. Other:  No abdominal ascites. Musculoskeletal: No focal osseous lesions. IMPRESSION: Scattered complex hepatic cysts measuring up to 7.6 cm, benign. Moderate hepatic steatosis. Tiny layering gallstones are better visualized on ultrasound. Associated gallbladder wall  thickening/edema, raising the possibility of acute cholecystitis in the appropriate clinical setting.  Common duct measures 8 mm, at the upper limits of normal. No choledocholithiasis is seen. Gallbladder adenomyomatosis, benign. 3.0 cm mildly irregular right lower pole renal cyst, benign (Bosniak II). Electronically Signed   By: Julian Hy M.D.   On: 03/29/2017 12:19   US Abdomen Limited Ruq  Result Date: 03/29/2017 CLINICAL DATA:  Epigastric and right upper quadrant pain radiating to the back since yesterday. EXAM: ULTRASOUND ABDOMEN LIMITED RIGHT UPPER QUADRANT COMPARISON:  None in PACs FINDINGS: Gallbladder: The gallbladder is adequately distended. Multiple small stones as well as sludge are observed. The largest stone measures just under 6 mm in diameter. There is gallbladder wall thickening and a small amount of pericholecystic fluid. There is no positive sonographic Murphy's sign. Common bile duct: Diameter: 6.4 mm Liver: The hepatic echotexture is mildly increased. There are multiple septated cystic appearing structures in the left hepatic lobe. The largest of these measures 6.6 x 7.4 x 7.1 cm. No intrahepatic ductal dilation is observed. Portal vein is patent on color Doppler imaging with normal direction of blood flow towards the liver. Incidental note is made of a complex lower pole right renal cyst measuring 3 x 3.4 x 2.7 cm. IMPRESSION: Gallstones and sludge with changes that is suggest acute or subacute cholecystitis. No positive sonographic Murphy's sign however. Complex appearing left lobe cystic structures in the liver as well as a complex lower pole cyst in the right kidney. When the patient can tolerate the procedure, further evaluation of the structures with MRI is recommended. Electronically Signed   By: David  Martinique M.D.   On: 03/29/2017 07:24    Anti-infectives: Anti-infectives (From admission, onward)   Start     Dose/Rate Route Frequency Ordered Stop   03/29/17 1000  cefTRIAXone (ROCEPHIN) 2 g in dextrose 5 % 50 mL IVPB     2 g 100 mL/hr over 30 Minutes Intravenous Every 24  hours 03/29/17 0914        Assessment/Plan:  62 year old female with acute cholecystitis. I discussed the procedure in detail of a laparoscopic cholecystectomy.  We discussed the risks and benefits of a laparoscopic cholecystectomy and possible cholangiogram including, but not limited to bleeding, infection, injury to surrounding structures such as the intestine or liver, bile leak, retained gallstones, need to convert to an open procedure, prolonged diarrhea, blood clots such as  DVT, common bile duct injury, anesthesia risks, and possible need for additional procedures.  The likelihood of improvement in symptoms and return to the patient's normal status is good. We discussed the typical post-operative recovery course.  Patient voiced understanding and desires to proceed.  Plan to proceed to the operating room later this morning.   Aubrynn Katona T. Adonis Huguenin, MD, [facility] General Surgeon [facility]  Day [facility] [phone] Night [facility] [phone] 03/30/2017

## 2017-03-30 NOTE — Anesthesia Procedure Notes (Signed)
Procedure Name: Intubation Performed by: Rolla Plate, CRNA Pre-anesthesia Checklist: Patient identified, Patient being monitored, Timeout performed, Emergency Drugs available and Suction available Patient Re-evaluated:Patient Re-evaluated prior to induction Oxygen Delivery Method: Circle system utilized Preoxygenation: Pre-oxygenation with 100% oxygen Induction Type: IV induction and Rapid sequence Ventilation: Mask ventilation without difficulty Laryngoscope Size: Miller and 2 Grade View: Grade I Tube type: Oral Tube size: 7.0 mm Number of attempts: 1 Airway Equipment and Method: Stylet Placement Confirmation: ETT inserted through vocal cords under direct vision,  positive ETCO2 and breath sounds checked- equal and bilateral Secured at: 21 cm Tube secured with: Tape Dental Injury: Teeth and Oropharynx as per pre-operative assessment

## 2017-03-30 NOTE — Transfer of Care (Signed)
Immediate Anesthesia Transfer of Care Note  Patient: Erica Cunningham  Procedure(s) Performed: LAPAROSCOPIC CHOLECYSTECTOMY (N/A )  Patient Location: PACU  Anesthesia Type:General  Level of Consciousness: sedated  Airway & Oxygen Therapy: Patient Spontanous Breathing and Patient connected to face mask oxygen  Post-op Assessment: Report given to RN and Post -op Vital signs reviewed and stable  Post vital signs: Reviewed  Last Vitals:  Vitals:   03/30/17 1435 03/30/17 1722  BP: 129/77 96/65  Pulse: 71 80  Resp: 16 16  Temp: (!) 38 C   SpO2: 93% 97%    Last Pain:  Vitals:   03/30/17 1435  TempSrc: Tympanic  PainSc: 5          Complications: No apparent anesthesia complications

## 2017-03-30 NOTE — Anesthesia Post-op Follow-up Note (Signed)
Anesthesia QCDR form completed.        

## 2017-03-30 NOTE — Anesthesia Preprocedure Evaluation (Signed)
Anesthesia Evaluation  Patient identified by MRN, date of birth, ID band Patient awake    Reviewed: Allergy & Precautions, H&P , NPO status , Patient's Chart, lab work & pertinent test results, reviewed documented beta blocker date and time   Airway Mallampati: II  TM Distance: <3 FB Neck ROM: full    Dental  (+) Teeth Intact   Pulmonary neg pulmonary ROS, Current Smoker,    Pulmonary exam normal        Cardiovascular Exercise Tolerance: Good hypertension, On Medications negative cardio ROS Normal cardiovascular exam Rhythm:regular Rate:Normal     Neuro/Psych negative neurological ROS  negative psych ROS   GI/Hepatic negative GI ROS, Neg liver ROS,   Endo/Other  negative endocrine ROSdiabetes  Renal/GU negative Renal ROS  negative genitourinary   Musculoskeletal   Abdominal   Peds  Hematology negative hematology ROS (+)   Anesthesia Other Findings Past Medical History: No date: Diabetes mellitus without complication (HCC) No date: Hypertension Past Surgical History: No date: ABDOMINAL HYSTERECTOMY BMI    Body Mass Index:  27.81 kg/m     Reproductive/Obstetrics negative OB ROS                             Anesthesia Physical Anesthesia Plan  ASA: II  Anesthesia Plan: General ETT   Post-op Pain Management:    Induction:   PONV Risk Score and Plan:   Airway Management Planned: Video Laryngoscope Planned  Additional Equipment:   Intra-op Plan:   Post-operative Plan:   Informed Consent: I have reviewed the patients History and Physical, chart, labs and discussed the procedure including the risks, benefits and alternatives for the proposed anesthesia with the patient or authorized representative who has indicated his/her understanding and acceptance.   Dental Advisory Given  Plan Discussed with: CRNA  Anesthesia Plan Comments:         Anesthesia Quick  Evaluation

## 2017-03-31 ENCOUNTER — Encounter: Payer: Self-pay | Admitting: General Surgery

## 2017-03-31 LAB — COMPREHENSIVE METABOLIC PANEL
ALT: 84 U/L — ABNORMAL HIGH (ref 14–54)
ANION GAP: 8 (ref 5–15)
AST: 72 U/L — ABNORMAL HIGH (ref 15–41)
Albumin: 2.7 g/dL — ABNORMAL LOW (ref 3.5–5.0)
Alkaline Phosphatase: 61 U/L (ref 38–126)
BUN: 9 mg/dL (ref 6–20)
CHLORIDE: 103 mmol/L (ref 101–111)
CO2: 27 mmol/L (ref 22–32)
Calcium: 8 mg/dL — ABNORMAL LOW (ref 8.9–10.3)
Creatinine, Ser: 0.53 mg/dL (ref 0.44–1.00)
GFR calc Af Amer: >60 mL/min (ref >60)
GFR calc non Af Amer: >60 mL/min (ref >60)
Glucose, Bld: 179 mg/dL — ABNORMAL HIGH (ref 65–99)
POTASSIUM: 3 mmol/L — AB (ref 3.5–5.1)
SODIUM: 138 mmol/L (ref 135–145)
Total Bilirubin: 0.7 mg/dL (ref 0.3–1.2)
Total Protein: 5.8 g/dL — ABNORMAL LOW (ref 6.5–8.1)

## 2017-03-31 LAB — CBC
HCT: 35.3 % (ref 35.0–47.0)
HEMOGLOBIN: 12.1 g/dL (ref 12.0–16.0)
MCH: 32.7 pg (ref 26.0–34.0)
MCHC: 34.3 g/dL (ref 32.0–36.0)
MCV: 95.4 fL (ref 80.0–100.0)
PLATELETS: 161 10*3/uL (ref 150–440)
RBC: 3.7 MIL/uL — ABNORMAL LOW (ref 3.80–5.20)
RDW: 13.6 % (ref 11.5–14.5)
WBC: 10.3 10*3/uL (ref 3.6–11.0)

## 2017-03-31 MED ORDER — METFORMIN HCL ER 500 MG PO TB24: 1000.0000 mg | ORAL_TABLET | Freq: Every day | ORAL | 0 refills | Status: AC

## 2017-03-31 MED ORDER — AMOXICILLIN-POT CLAVULANATE 875-125 MG PO TABS
1.0000 | ORAL_TABLET | Freq: Two times a day (BID) | ORAL | Status: DC
  Administered 2017-03-31: 1 via ORAL
  Filled 2017-03-31: qty 1

## 2017-03-31 MED ORDER — METFORMIN HCL ER 500 MG PO TB24: 500.0000 mg | ORAL_TABLET | Freq: Every day | ORAL | 0 refills | Status: DC

## 2017-03-31 MED ORDER — ONDANSETRON 4 MG PO TBDP: 4.0000 mg | ORAL_TABLET | Freq: Four times a day (QID) | ORAL | 0 refills | Status: DC | PRN

## 2017-03-31 MED ORDER — AMOXICILLIN-POT CLAVULANATE 875-125 MG PO TABS: 1.0000 | ORAL_TABLET | Freq: Two times a day (BID) | ORAL | 0 refills | Status: DC

## 2017-03-31 MED ORDER — OXYCODONE-ACETAMINOPHEN 5-325 MG PO TABS: 1.0000 | ORAL_TABLET | ORAL | 0 refills | Status: DC | PRN

## 2017-03-31 NOTE — Discharge Summary (Signed)
Patient ID: Erica Cunningham MRN: [id number] DOB/AGE: 12-15-1955 62 y.o.  Admit date: 03/29/2017 Discharge date: 03/31/2017  Discharge Diagnoses:  Cholecystitis   Procedures Performed: Laparoscopic cholecystectomy  Discharged Condition: good  Hospital Course: Patient admitted with cholecystitis. Tolerated procedure well. Was able to be discharged home the first post operative day.  Discharge Orders: Discharge Instructions    Call MD for:  persistant nausea and vomiting   Complete by:  As directed    Call MD for:  redness, tenderness, or signs of infection (pain, swelling, redness, odor or green/yellow discharge around incision site)   Complete by:  As directed    Call MD for:  severe uncontrolled pain   Complete by:  As directed    Call MD for:  temperature >100.4   Complete by:  As directed    Diet - low sodium heart healthy   Complete by:  As directed    Increase activity slowly   Complete by:  As directed       Disposition: Final discharge disposition not confirmed  Discharge Medications: Allergies as of 03/31/2017   No Known Allergies     Medication List    TAKE these medications   amoxicillin-clavulanate 875-125 MG tablet Commonly known as:  AUGMENTIN Take 1 tablet by mouth every 12 (twelve) hours.   aspirin EC 81 MG tablet Take 81 mg by mouth daily.   atorvastatin 40 MG tablet Commonly known as:  LIPITOR Take 1 tablet by mouth daily.   chlorthalidone 50 MG tablet Commonly known as:  HYGROTON Take 1 tablet by mouth daily.   colestipol 1 g tablet Commonly known as:  COLESTID Take 1 tablet by mouth 2 (two) times daily.   diltiazem 120 MG 24 hr capsule Commonly known as:  CARDIZEM CD Take 1 capsule by mouth daily.   folic acid 1 MG tablet Commonly known as:  FOLVITE Take 1 tablet by mouth daily.   JANUVIA 100 MG tablet Generic drug:  sitaGLIPtin Take 1 tablet by mouth daily.   losartan 50 MG tablet Commonly known as:  COZAAR Take 1 tablet by mouth  daily.   metFORMIN 500 MG 24 hr tablet Commonly known as:  GLUCOPHAGE-XR Take 2 tablets (1,000 mg total) by mouth daily. What changed:  how much to take   methotrexate 2.5 MG tablet Commonly known as:  RHEUMATREX Take 12.5 mg by mouth once a week.   ondansetron 4 MG disintegrating tablet Commonly known as:  ZOFRAN-ODT Take 1 tablet (4 mg total) by mouth every 6 (six) hours as needed for nausea.   oxyCODONE-acetaminophen 5-325 MG tablet Commonly known as:  PERCOCET/ROXICET Take 1-2 tablets by mouth every 4 (four) hours as needed for moderate pain or severe pain.   Potassium Citrate 15 MEQ (1620 MG) Tbcr Take 1 tablet by mouth daily.   PROVENTIL HFA 108 (90 Base) MCG/ACT inhaler Generic drug:  albuterol Inhale 2 puffs into the lungs every 6 (six) hours as needed.        Follwup: Follow-up Information    Clayburn Pert, MD. Schedule an appointment as soon as possible for a visit in 1 week(s).   Specialty:  General Surgery Why:  postop drain Contact information: [address] [place] [address] [phone]           Signed: Clayburn Pert 03/31/2017, 11:58 AM

## 2017-03-31 NOTE — Discharge Instructions (Signed)
Laparoscopic Cholecystectomy, Care After This sheet gives you information about how to care for yourself after your procedure. Your doctor may also give you more specific instructions. If you have problems or questions, contact your doctor. Follow these instructions at home: Care for cuts from surgery (incisions)   Follow instructions from your doctor about how to take care of your cuts from surgery. Make sure you: ? Wash your hands with soap and water before you change your bandage (dressing). If you cannot use soap and water, use hand sanitizer. ? Change your bandage as told by your doctor. Remove initial dressings Friday 04/01/2017. Change dressings Daily until there is no drainage on dressings. ? Leave stitches (sutures), skin glue, or skin tape (adhesive) strips in place. They may need to stay in place for 2 weeks or longer. If tape strips get loose and curl up, you may trim the loose edges. Do not remove tape strips completely unless your doctor says it is okay.  Do not take baths, swim, or use a hot tub until your doctor says it is okay. Ask your doctor if you can take showers. You may only be allowed to take sponge baths for bathing. OK to shower tomorrow, avoid direct water flow to the drain.  Check your surgical cut area every day for signs of infection. Check for: ? More redness, swelling, or pain. ? More fluid or blood. ? Warmth. ? Pus or a bad smell. Activity  Do not drive or use heavy machinery while taking prescription pain medicine.  Do not lift anything that is heavier than 10 lb (4.5 kg) until your doctor says it is okay.  Do not play contact sports until your doctor says it is okay.  Do not drive for 24 hours if you were given a medicine to help you relax (sedative).  Rest as needed. Do not return to work or school until your doctor says it is okay. General instructions  Take over-the-counter and prescription medicines only as told by your doctor.  To prevent or  treat constipation while you are taking prescription pain medicine, your doctor may recommend that you: ? Drink enough fluid to keep your pee (urine) clear or pale yellow. ? Take over-the-counter or prescription medicines. ? Eat foods that are high in fiber, such as fresh fruits and vegetables, whole grains, and beans. ? Limit foods that are high in fat and processed sugars, such as fried and sweet foods. Contact a doctor if:  You develop a rash.  You have more redness, swelling, or pain around your surgical cuts.  You have more fluid or blood coming from your surgical cuts.  Your surgical cuts feel warm to the touch.  You have pus or a bad smell coming from your surgical cuts.  You have a fever.  One or more of your surgical cuts breaks open. Get help right away if:  You have trouble breathing.  You have chest pain.  You have pain that is getting worse in your shoulders.  You faint or feel dizzy when you stand.  You have very bad pain in your belly (abdomen).  You are sick to your stomach (nauseous) for more than one day.  You have throwing up (vomiting) that lasts for more than one day.  You have leg pain. This information is not intended to replace advice given to you by your health care provider. Make sure you discuss any questions you have with your health care provider. Document Released: 12/16/2007 Document Revised: 09/27/2015  Document Reviewed: 08/25/2015 Elsevier Interactive Patient Education  [employer].

## 2017-03-31 NOTE — Progress Notes (Signed)
Discharge order received. Patient is alert and oriented. Vital signs stable . No signs of acute distress. Discharge instructions given. Patient verbalized understanding.  Incision clean dry and intact. JP remains in placed.  No other issues noted at this time

## 2017-03-31 NOTE — Progress Notes (Signed)
1 Day Post-Op   Subjective: Patient reports having soreness at her surgical sites but feeling better than before surgery.  Tolerating a clear liquid diet without nausea or vomiting.  Passing flatus.  Pain currently controlled.  Vital signs in last 24 hours: Temp:  [97.4 F (36.3 C)-100.4 F (38 C)] 98 F (36.7 C) (01/10 0438) Pulse Rate:  [71-87] 72 (01/10 0438) Resp:  [11-24] 18 (01/10 0438) BP: (96-131)/(50-78) 96/62 (01/10 0438) SpO2:  [89 %-99 %] 91 % (01/10 0438) Last BM Date: 03/28/17  Intake/Output from previous day: 01/09 0701 - 01/10 0700 In: 4020 [P.O.:720; I.V.:2900; IV Piggyback:400] Out: 1941 [Urine:3000; Drains:140; Blood:100]  GI: Abdomen is soft, appropriately tender to palpation at the incision sites, nondistended.  Incisions are dressed that are clean, dry, intact.  JP drain in the right upper quadrant with a serosanguineous output.  Lab Results:  CBC Recent Labs    03/30/17 0403 03/31/17 0501  WBC 11.6* 10.3  HGB 13.3 12.1  HCT 38.8 35.3  PLT 176 161   CMP     Component Value Date/Time   NA 138 03/31/2017 0501   K 3.0 (L) 03/31/2017 0501   CL 103 03/31/2017 0501   CO2 27 03/31/2017 0501   GLUCOSE 179 (H) 03/31/2017 0501   BUN 9 03/31/2017 0501   CREATININE 0.53 03/31/2017 0501   CALCIUM 8.0 (L) 03/31/2017 0501   PROT 5.8 (L) 03/31/2017 0501   ALBUMIN 2.7 (L) 03/31/2017 0501   AST 72 (H) 03/31/2017 0501   ALT 84 (H) 03/31/2017 0501   ALKPHOS 61 03/31/2017 0501   BILITOT 0.7 03/31/2017 0501   GFRNONAA >60 03/31/2017 0501   GFRAA >60 03/31/2017 0501   PT/INR No results for input(s): LABPROT, INR in the last 72 hours.  Studies/Results: Mr Abdomen W Wo Contrast  Result Date: 03/29/2017 CLINICAL DATA:  Abdominal pain, abnormal ultrasound EXAM: MRI ABDOMEN WITHOUT AND WITH CONTRAST TECHNIQUE: Multiplanar multisequence MR imaging of the abdomen was performed both before and after the administration of intravenous contrast. CONTRAST:  62mL  MULTIHANCE GADOBENATE DIMEGLUMINE 529 MG/ML IV SOLN COMPARISON:  Right upper quadrant ultrasound dated 03/29/2017 FINDINGS: Lower chest: Lung bases are clear. Hepatobiliary: Moderate geographic hepatic steatosis. Scattered hepatic cysts, including a complex/septated 7.6 cm cyst in the left hepatic lobe (series 7/image 14), benign. No suspicious/enhancing hepatic lesions. Tiny layering gallstones are better visualized on ultrasound. Associated mild gallbladder wall thickening/edema (series 3/image 17). Nodularity of the gallbladder fundus (series 2/images 18 and 19), reflecting gallbladder adenomyomatosis. No intrahepatic or extrahepatic ductal dilatation. Common duct measures 8 mm, at the upper limits of normal. No choledocholithiasis is seen. Pancreas:  Within normal limits. Spleen:  Within normal limits. Adrenals/Urinary Tract:  Adrenal glands are within normal limits. 3.0 cm mildly irregular right lower pole renal cyst with suspected thin septation (series 3/image 26), without enhancement following contrast administration, benign (Bosniak II). Left kidney is within normal limits. No hydronephrosis. Stomach/Bowel: Stomach is within normal limits. Visualized bowel is unremarkable. Vascular/Lymphatic:  No evidence of abdominal aortic aneurysm. No suspicious abdominal lymphadenopathy. Other:  No abdominal ascites. Musculoskeletal: No focal osseous lesions. IMPRESSION: Scattered complex hepatic cysts measuring up to 7.6 cm, benign. Moderate hepatic steatosis. Tiny layering gallstones are better visualized on ultrasound. Associated gallbladder wall thickening/edema, raising the possibility of acute cholecystitis in the appropriate clinical setting. Common duct measures 8 mm, at the upper limits of normal. No choledocholithiasis is seen. Gallbladder adenomyomatosis, benign. 3.0 cm mildly irregular right lower pole renal cyst, benign (Bosniak II).  Electronically Signed   By: Julian Hy M.D.   On: 03/29/2017 12:19     Assessment/Plan: 62 year old female status post a laparoscopic cholecystectomy for acute on chronic cholecystitis.  Doing well.  Plan to advance diet to regular this morning.  Transition to oral antibiotics and pain medications.  Encourage ambulation and teach how to maintain her drain.  Likely able to be discharged home after lunch.   Clayburn Pert, MD [facility]  Day [facility] [phone] Night [facility] [phone]  03/31/2017

## 2017-04-01 LAB — HIV ANTIBODY (ROUTINE TESTING W REFLEX): HIV SCREEN 4TH GENERATION: NONREACTIVE

## 2017-04-01 LAB — SURGICAL PATHOLOGY

## 2017-04-01 NOTE — Anesthesia Postprocedure Evaluation (Signed)
Anesthesia Post Note  Patient: Erica Cunningham  Procedure(s) Performed: LAPAROSCOPIC CHOLECYSTECTOMY (N/A )  Patient location during evaluation: PACU Anesthesia Type: General Level of consciousness: awake and alert Pain management: pain level controlled Vital Signs Assessment: post-procedure vital signs reviewed and stable Respiratory status: spontaneous breathing, nonlabored ventilation, respiratory function stable and patient connected to nasal cannula oxygen Cardiovascular status: blood pressure returned to baseline and stable Postop Assessment: no apparent nausea or vomiting Anesthetic complications: no     Last Vitals:  Vitals:   03/31/17 0438 03/31/17 1231  BP: 96/62 101/70  Pulse: 72 81  Resp: 18 18  Temp: 36.7 C 37.1 C  SpO2: 91% 95%    Last Pain:  Vitals:   03/31/17 1231  TempSrc: Oral  PainSc:                  Martha Clan

## 2017-04-04 ENCOUNTER — Other Ambulatory Visit: Payer: Self-pay

## 2017-04-04 DIAGNOSIS — Z720 Tobacco use: Secondary | ICD-10-CM | POA: Insufficient documentation

## 2017-04-04 DIAGNOSIS — E8881 Metabolic syndrome: Secondary | ICD-10-CM | POA: Insufficient documentation

## 2017-04-04 DIAGNOSIS — Z8249 Family history of ischemic heart disease and other diseases of the circulatory system: Secondary | ICD-10-CM | POA: Insufficient documentation

## 2017-04-04 DIAGNOSIS — C4491 Basal cell carcinoma of skin, unspecified: Secondary | ICD-10-CM | POA: Insufficient documentation

## 2017-04-04 DIAGNOSIS — I10 Essential (primary) hypertension: Secondary | ICD-10-CM | POA: Insufficient documentation

## 2017-04-04 DIAGNOSIS — R152 Fecal urgency: Secondary | ICD-10-CM | POA: Insufficient documentation

## 2017-04-04 DIAGNOSIS — N200 Calculus of kidney: Secondary | ICD-10-CM | POA: Insufficient documentation

## 2017-04-04 DIAGNOSIS — G5601 Carpal tunnel syndrome, right upper limb: Secondary | ICD-10-CM | POA: Insufficient documentation

## 2017-04-04 DIAGNOSIS — E1169 Type 2 diabetes mellitus with other specified complication: Secondary | ICD-10-CM | POA: Insufficient documentation

## 2017-04-04 DIAGNOSIS — E1142 Type 2 diabetes mellitus with diabetic polyneuropathy: Secondary | ICD-10-CM | POA: Insufficient documentation

## 2017-04-04 DIAGNOSIS — E785 Hyperlipidemia, unspecified: Secondary | ICD-10-CM

## 2017-04-06 ENCOUNTER — Encounter: Payer: Self-pay | Admitting: General Surgery

## 2017-04-06 ENCOUNTER — Ambulatory Visit (INDEPENDENT_AMBULATORY_CARE_PROVIDER_SITE_OTHER): Payer: BC Managed Care – PPO | Admitting: General Surgery

## 2017-04-06 VITALS — BP 90/61 | HR 99 | Temp 97.9°F | Ht 63.0 in | Wt 153.0 lb

## 2017-04-06 DIAGNOSIS — Z4889 Encounter for other specified surgical aftercare: Secondary | ICD-10-CM

## 2017-04-06 NOTE — Patient Instructions (Signed)
Please keep your drain site covered until you no longer have drainage from this area.  You may shower but keep the area covered and change the dressing after showering.   We will follow up with you as listed below:  If you have any questions or concerns please give our office a call.

## 2017-04-06 NOTE — Progress Notes (Signed)
Outpatient Surgical Follow Up  04/06/2017  Erica Cunningham is an 62 y.o. female.   Chief Complaint  Patient presents with  . Routine Post Op     post op--laparoscopic cholecystectomy-has drains placed- Dr Adonis Huguenin 03/29/17    HPI: 62 year old female returns to clinic for follow-up now 8 days status post laparoscopic cholecystectomy for perforated gangrenous cholecystitis.  Patient still has a drain in place.  States her pain is well controlled but continues to take the occasional pain medication.  She denies any fevers, chills, nausea, vomiting, chest pain, shortness of breath, diarrhea, constipation.  Past Medical History:  Diagnosis Date  . Diabetes mellitus without complication ([place])   . Hypertension     Past Surgical History:  Procedure Laterality Date  . ABDOMINAL HYSTERECTOMY    . CHOLECYSTECTOMY N/A 03/30/2017   Procedure: LAPAROSCOPIC CHOLECYSTECTOMY;  Surgeon: Clayburn Pert, MD;  Location: [facility] ORS;  Service: General;  Laterality: N/A;    History reviewed. No pertinent family history.  Social History:  reports that she has been smoking.  she has never used smokeless tobacco. She reports that she drinks about 0.6 - 1.2 oz of alcohol per week. She reports that she does not use drugs.  Allergies:  Allergies  Allergen Reactions  . Ace Inhibitors Other (See Comments)    cough cough cough     Medications reviewed.    ROS A multipoint review of systems was completed, all pertinent positives and negatives are documented within the HPI and the remainder are negative   BP 90/61   Pulse 99   Temp 97.9 F (36.6 C) (Oral)   Ht 5\' 3"  (1.6 m)   Wt 69.4 kg (153 lb)   BMI 27.10 kg/m   Physical Exam  General: No acute distress Chest: Clear to auscultation  heart: Regular rhythm Abdomen: Soft, nontender, nondistended.  JP drain in the right upper quadrant with a serosanguineous output.  The remaining laparoscopic incision sites are well approximated with Steri-Strips  still in place.   No results found for this or any previous visit (from the past 48 hour(s)). No results found.  Assessment/Plan:  1. Aftercare following surgery 62 year old female status post laparoscopic cholecystectomy.  Drain removed in clinic today without any difficulty.  Discussed that the area of the skin will continue to drain for a few days.  Counseled as the signs and symptoms of infection and to report for medical care immediately should they occur.  Otherwise she will follow-up in clinic next week in [place] for 1 additional wound check before being discharged from clinic.     Clayburn Pert, MD FACS General Surgeon  04/06/2017,10:59 AM

## 2017-04-12 ENCOUNTER — Encounter: Payer: Self-pay | Admitting: Surgery

## 2017-04-12 ENCOUNTER — Ambulatory Visit (INDEPENDENT_AMBULATORY_CARE_PROVIDER_SITE_OTHER): Payer: BC Managed Care – PPO | Admitting: Surgery

## 2017-04-12 VITALS — BP 118/69 | HR 115 | Temp 97.8°F | Wt 154.0 lb

## 2017-04-12 DIAGNOSIS — K819 Cholecystitis, unspecified: Secondary | ICD-10-CM

## 2017-04-12 NOTE — Patient Instructions (Signed)
Please give us a call in case you have any questions or concerns.  

## 2017-04-12 NOTE — Progress Notes (Signed)
Surgical Clinic Progress/Follow-up Note   HPI:  62 y.o. Female presents to clinic for post-op follow-up evaluation s/p laparoscopic cholecystectomy for acute cholecystitis. Patient had her drain paced during surgery removed last week and returns for follow-up wound assessment. She otherwise reports complete resolution of pre-operative abdominal pain and occassional pre-operative post-prandial RUQ abdominal pain and has been tolerating regular diet with +flatus and increasingly normal (less loose) BM's, denies N/V, fever/chills, CP, or SOB.  Review of Systems:  Constitutional: denies any other weight loss, fever, chills, or sweats  Eyes: denies any other vision changes, history of eye injury  ENT: denies sore throat, hearing problems  Respiratory: denies shortness of breath, wheezing  Cardiovascular: denies chest pain, palpitations  Gastrointestinal: abdominal pain, N/V, and bowel function as per HPI Musculoskeletal: denies any other joint pains or cramps  Skin: Denies any other rashes or skin discolorations  Neurological: denies any other headache, dizziness, weakness  Psychiatric: denies any other depression, anxiety  All other review of systems: otherwise negative   Vital Signs:  BP 118/69   Pulse (!) 115   Temp 97.8 F (36.6 C) (Oral)   Wt 154 lb (69.9 kg)   BMI 27.28 kg/m    Physical Exam:  Constitutional:  -- Normal body habitus  -- Awake, alert, and oriented x3  Eyes:  -- Pupils equally round and reactive to light  -- No scleral icterus  Ear, nose, throat:  -- No jugular venous distension  -- No nasal drainage, bleeding Pulmonary:  -- No crackles -- Equal breath sounds bilaterally -- Breathing non-labored at rest Cardiovascular:  -- S1, S2 present  -- No pericardial rubs  Gastrointestinal:  -- Soft, nontender, non-distended, no guarding/rebound -- Incisions well-approximated without surrounding erythema or drainage -- No abdominal masses appreciated, pulsatile  or otherwise  Musculoskeletal / Integumentary:  -- Wounds or skin discoloration: None appreciated except post-surgical wounds as described above (GI) -- Extremities: B/L UE and LE FROM, hands and feet warm, no edema  Neurologic:  -- Motor function: intact and symmetric  -- Sensation: intact and symmetric   Assessment:  62 y.o. yo Female with a problem list including...  Patient Active Problem List   Diagnosis Date Noted  . Dysmetabolic syndrome X 63/03/6008  . Essential hypertension 04/04/2017  . Family history of ischemic heart disease 04/04/2017  . Hyperlipidemia associated with type 2 diabetes mellitus ([place]) 04/04/2017  . Nephrolithiasis 04/04/2017  . Rectal urgency 04/04/2017  . Recurrent basal cell carcinoma 04/04/2017  . Right carpal tunnel syndrome 04/04/2017  . Tobacco use 04/04/2017  . Type 2 diabetes mellitus with diabetic polyneuropathy, without long-term current use of insulin ([place]) 04/04/2017  . Cholecystitis 03/29/2017  . Encounter for long-term (current) use of high-risk medication 11/03/2016  . Psoriatic arthritis ([place]) 11/03/2016  . Psoriasis (a type of skin inflammation) 11/20/2015  . 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 Swelling of joint of right hand 11/20/2015    presents to clinic for post-op follow-up evaluation, doing well s/p laparoscopic cholecystectomy and for acute cholecystitis and drain removal last week.  Plan:              - advance diet as tolerated              - okay to submerge incisions under water (baths, swimming) prn             - gradually resume all activities without restrictions over next 2 weeks             -  apply sunblock particularly to incisions with sun exposure to reduce pigmentation of scars             - return to clinic as needed, instructed to call office if any questions or concerns   All of the above recommendations were discussed with the patient, and all of patient's questions were answered to his  expressed satisfaction.  -- Erica Drivers Rosana Hoes, MD, [place]: [facility] for exceptional care. Office: [phone]

## 2017-04-18 ENCOUNTER — Telehealth: Payer: Self-pay

## 2017-04-18 NOTE — Telephone Encounter (Signed)
Disability paperwork faxed to [facility] at this time. Patient notified. Placed in scan folder.

## 2018-05-18 ENCOUNTER — Other Ambulatory Visit (HOSPITAL_COMMUNITY): Payer: Self-pay | Admitting: Internal Medicine

## 2018-05-18 ENCOUNTER — Other Ambulatory Visit: Payer: Self-pay | Admitting: Internal Medicine

## 2018-05-18 DIAGNOSIS — M25441 Effusion, right hand: Secondary | ICD-10-CM

## 2018-05-25 ENCOUNTER — Ambulatory Visit: Payer: BC Managed Care – PPO

## 2018-06-09 ENCOUNTER — Ambulatory Visit: Payer: BC Managed Care – PPO

## 2018-07-18 ENCOUNTER — Ambulatory Visit
Admission: RE | Admit: 2018-07-18 | Discharge: 2018-07-18 | Disposition: A | Discharge status: Home / Self Care | Payer: BC Managed Care – PPO | Source: Ambulatory Visit | Attending: Internal Medicine | Admitting: Internal Medicine

## 2018-07-18 ENCOUNTER — Other Ambulatory Visit: Payer: Self-pay

## 2018-07-18 ENCOUNTER — Ambulatory Visit: Payer: BC Managed Care – PPO

## 2018-07-18 DIAGNOSIS — M25441 Effusion, right hand: Secondary | ICD-10-CM | POA: Insufficient documentation

## 2018-07-18 LAB — POCT I-STAT CREATININE: Creatinine, Ser: 0.6 mg/dL (ref 0.44–1.00)

## 2018-07-18 MED ORDER — GADOBUTROL 1 MMOL/ML IV SOLN
6.0000 mL | Freq: Once | INTRAVENOUS | Status: AC | PRN
  Administered 2018-07-18: 10:00:00 6 mL via INTRAVENOUS

## 2019-07-11 ENCOUNTER — Other Ambulatory Visit: Payer: Self-pay | Admitting: Surgery

## 2019-07-11 DIAGNOSIS — S83232D Complex tear of medial meniscus, current injury, left knee, subsequent encounter: Secondary | ICD-10-CM

## 2019-07-21 DIAGNOSIS — S83242A Other tear of medial meniscus, current injury, left knee, initial encounter: Secondary | ICD-10-CM

## 2019-07-21 HISTORY — DX: Other tear of medial meniscus, current injury, left knee, initial encounter: S83.242A

## 2019-07-24 ENCOUNTER — Ambulatory Visit
Admission: RE | Admit: 2019-07-24 | Discharge: 2019-07-24 | Disposition: A | Discharge status: Home / Self Care | Payer: BC Managed Care – PPO | Source: Ambulatory Visit | Attending: Surgery | Admitting: Surgery

## 2019-07-24 ENCOUNTER — Other Ambulatory Visit: Payer: Self-pay

## 2019-07-24 DIAGNOSIS — S83232D Complex tear of medial meniscus, current injury, left knee, subsequent encounter: Secondary | ICD-10-CM | POA: Diagnosis not present

## 2019-08-01 HISTORY — PX: COLONOSCOPY: SHX174

## 2019-08-13 ENCOUNTER — Other Ambulatory Visit: Payer: Self-pay | Admitting: Surgery

## 2019-08-23 ENCOUNTER — Inpatient Hospital Stay: Admission: RE | Admit: 2019-08-23 | Payer: BC Managed Care – PPO | Source: Ambulatory Visit

## 2019-08-28 ENCOUNTER — Other Ambulatory Visit: Payer: Self-pay

## 2019-08-28 ENCOUNTER — Encounter
Admission: RE | Admit: 2019-08-28 | Discharge: 2019-08-28 | Disposition: A | Discharge status: Home / Self Care | Payer: BC Managed Care – PPO | Source: Ambulatory Visit | Attending: Surgery | Admitting: Surgery

## 2019-08-28 DIAGNOSIS — Z01818 Encounter for other preprocedural examination: Secondary | ICD-10-CM | POA: Insufficient documentation

## 2019-08-28 HISTORY — DX: Malignant (primary) neoplasm, unspecified: C80.1

## 2019-08-28 HISTORY — DX: Chronic kidney disease, unspecified: N18.9

## 2019-08-28 HISTORY — DX: Gastro-esophageal reflux disease without esophagitis: K21.9

## 2019-08-28 HISTORY — DX: Personal history of urinary calculi: Z87.442

## 2019-08-28 HISTORY — DX: Myoneural disorder, unspecified: G70.9

## 2019-08-28 HISTORY — DX: Pneumonia, unspecified organism: J18.9

## 2019-08-28 HISTORY — DX: Other specified postprocedural states: Z98.890

## 2019-08-28 HISTORY — DX: Unspecified osteoarthritis, unspecified site: M19.90

## 2019-08-28 HISTORY — DX: Other complications of anesthesia, initial encounter: T88.59XA

## 2019-08-28 HISTORY — DX: Other specified postprocedural states: R11.2

## 2019-08-28 NOTE — Patient Instructions (Signed)
INSTRUCTIONS FOR SURGERY     Your surgery is scheduled for:   Tuesday, June 15TH     To find out your arrival time for the day of surgery,          please call [phone] between 1 pm and 3 pm on :  Monday, June 14TH     When you arrive for surgery, report to the [facility].       Do NOT stop on the first floor to register.    REMEMBER: Instructions that are not followed completely may result in serious medical risk,  up to and including death, or upon the discretion of your surgeon and anesthesiologist,            your surgery may need to be rescheduled.  __X__ 1. Do not eat food after midnight the night before your procedure.                    No gum, candy, lozenger, tic tacs, tums or hard candies.                  ABSOLUTELY NOTHING SOLID IN YOUR MOUTH AFTER MIDNIGHT                    You may drink unlimited clear liquids up to 2 hours before you are scheduled to arrive for surgery.                   Do not drink anything within those 2 hours unless you need to take medicine, then take the                   smallest amount you need.  Clear liquids include:  water, apple juice without pulp,                   any flavor Gatorade, Black coffee, black tea.  Sugar may be added but no dairy/ honey /lemon.                        Broth and jello is not considered a clear liquid.  __x__  2. On the morning of surgery, please brush your teeth with toothpaste and water. You may rinse with                  mouthwash if you wish but DO NOT SWALLOW TOOTHPASTE OR MOUTHWASH  __X___3. NO alcohol for 24 hours before or after surgery.  __x___ 4.  Do NOT smoke or use e-cigarettes for 24 HOURS PRIOR TO SURGERY.                      DO NOT Use any chewable tobacco products for at least 6 hours prior to surgery.  __x___ 5. If you start any new medication after this appointment and prior to surgery, please                    Bring it with you on the day of surgery.  ___x__ 6. Notify your doctor if there is any change in  your medical condition, such as fever, infection, vomitting,                   Diarrhea or any open sores.  __x___ 7.  USE the CHG SOAP as instructed, the night before surgery and the day of surgery.                   Once you have washed with this soap, do NOT use any of the following: Powders, perfumes                    or lotions. Please do not wear make up, hairpins, clips or nail polish. You MAY wear deodorant.                   Men may shave their face and neck.  Women need to shave 48 hours prior to surgery.                   DO NOT wear ANY jewelry on the day of surgery. If there are rings that are too tight to                    remove easily, please address this prior to the surgery day. Piercings need to be removed.                                                                     NO METAL ON YOUR BODY.                    Do NOT bring any valuables.  If you came to Pre-Admit testing then you will not need license,                     insurance card or credit card.  If you will be staying overnight, please either leave your things in                     the car or have your family be responsible for these items.                     [facility] IS NOT RESPONSIBLE FOR BELONGINGS OR VALUABLES.  ___X__ 8. DO NOT wear contact lenses on surgery day.  You may not have dentures,                     Hearing aides, contacts or glasses in the operating room. These items can be                    Placed in the Recovery Room to receive immediately after surgery.  __x___ 9. IF YOU ARE SCHEDULED TO GO HOME ON THE SAME DAY, YOU MUST                   Have someone to drive you home and to stay with you  for the first 24 hours.                    Have an arrangement prior to arriving on surgery day.  ___x__ 10. Take the following medications on  the morning of surgery with a sip of water:                               1. PRILOSEC                     2. DILTIAZEM                     3.                     __X___ 11.  Follow any instructions provided to you by your surgeon.                        Such as enema, clear liquid bowel prep                       ##PLEASE CONSUME ALL OF THE G2 DRINK WITHIN 2 HOURS OF                         ARRIVAL AT [place].##  __X__  12. STOP  ASPIRIN AND ALL ASPIRIN PRODUCTS AS OF: 08/28/19(one week prior)                       THIS INCLUDES BC POWDERS / GOODIES POWDER  __x___ 13. STOP Anti-inflammatories as of: 08/28/19 (one week prior)                      This includes IBUPROFEN / MOTRIN / ADVIL / ALEVE/ NAPROXYN                    YOU MAY TAKE TYLENOL ANY TIME PRIOR TO SURGERY.  __x____16.  Stop Metformin 2 full days prior to surgery.  LAST DOSE ON Saturday 09/01/19                     TAKE 1/2 OF USUAL INSULIN DOSE ON THE EVENING PRIOR TO SURGERY.                     Do NOT take any diabetes medications on surgery day.  ____X__17.  Continue to take the following medications but do not take on the morning of surgery:                           HYGROTON // POTASSIUM CHLORIDE // POTASSIUM CITRATE // MAGNESIUM  ___X___18. If staying overnight, please have appropriate shoes to wear to be able to walk around the unit.                   Wear clean and comfortable clothing to the hospital.  [place]. PLEASE BRING TELEPHONE NUMBERS FOR YOUR CONTACTS.

## 2019-08-29 ENCOUNTER — Encounter
Admission: RE | Admit: 2019-08-29 | Discharge: 2019-08-29 | Disposition: A | Discharge status: Home / Self Care | Payer: BC Managed Care – PPO | Source: Ambulatory Visit | Attending: Surgery | Admitting: Surgery

## 2019-08-29 ENCOUNTER — Other Ambulatory Visit: Payer: BC Managed Care – PPO

## 2019-08-29 DIAGNOSIS — Z01818 Encounter for other preprocedural examination: Secondary | ICD-10-CM | POA: Insufficient documentation

## 2019-08-29 LAB — URINALYSIS, ROUTINE W REFLEX MICROSCOPIC
Bilirubin Urine: NEGATIVE
Glucose, UA: NEGATIVE mg/dL
Hgb urine dipstick: NEGATIVE
Ketones, ur: NEGATIVE mg/dL
Leukocytes,Ua: NEGATIVE
Nitrite: NEGATIVE
Protein, ur: NEGATIVE mg/dL
Specific Gravity, Urine: 1.003 — ABNORMAL LOW (ref 1.005–1.030)
pH: 9 — ABNORMAL HIGH (ref 5.0–8.0)

## 2019-08-29 LAB — SURGICAL PCR SCREEN
MRSA, PCR: NEGATIVE
Staphylococcus aureus: NEGATIVE

## 2019-08-29 LAB — COMPREHENSIVE METABOLIC PANEL
ALT: 35 U/L (ref 0–44)
AST: 27 U/L (ref 15–41)
Albumin: 3.8 g/dL (ref 3.5–5.0)
Alkaline Phosphatase: 90 U/L (ref 38–126)
Anion gap: 8 (ref 5–15)
BUN: 11 mg/dL (ref 8–23)
CO2: 31 mmol/L (ref 22–32)
Calcium: 9.3 mg/dL (ref 8.9–10.3)
Chloride: 100 mmol/L (ref 98–111)
Creatinine, Ser: 0.62 mg/dL (ref 0.44–1.00)
GFR calc Af Amer: >60 mL/min (ref >60)
GFR calc non Af Amer: >60 mL/min (ref >60)
Glucose, Bld: 111 mg/dL — ABNORMAL HIGH (ref 70–99)
Potassium: 3.4 mmol/L — ABNORMAL LOW (ref 3.5–5.1)
Sodium: 139 mmol/L (ref 135–145)
Total Bilirubin: 0.8 mg/dL (ref 0.3–1.2)
Total Protein: 6.9 g/dL (ref 6.5–8.1)

## 2019-08-29 LAB — CBC WITH DIFFERENTIAL/PLATELET
Abs Immature Granulocytes: 0.01 10*3/uL (ref 0.00–0.07)
Basophils Absolute: 0 10*3/uL (ref 0.0–0.1)
Basophils Relative: 1 %
Eosinophils Absolute: 0.1 10*3/uL (ref 0.0–0.5)
Eosinophils Relative: 1 %
HCT: 43.1 % (ref 36.0–46.0)
Hemoglobin: 14.9 g/dL (ref 12.0–15.0)
Immature Granulocytes: 0 %
Lymphocytes Relative: 25 %
Lymphs Abs: 2.1 10*3/uL (ref 0.7–4.0)
MCH: 31.2 pg (ref 26.0–34.0)
MCHC: 34.6 g/dL (ref 30.0–36.0)
MCV: 90.4 fL (ref 80.0–100.0)
Monocytes Absolute: 0.5 10*3/uL (ref 0.1–1.0)
Monocytes Relative: 6 %
Neutro Abs: 5.6 10*3/uL (ref 1.7–7.7)
Neutrophils Relative %: 67 %
Platelets: 274 10*3/uL (ref 150–400)
RBC: 4.77 MIL/uL (ref 3.87–5.11)
RDW: 12.5 % (ref 11.5–15.5)
WBC: 8.4 10*3/uL (ref 4.0–10.5)
nRBC: 0 % (ref 0.0–0.2)

## 2019-08-31 ENCOUNTER — Other Ambulatory Visit
Admission: RE | Admit: 2019-08-31 | Discharge: 2019-08-31 | Disposition: A | Discharge status: Home / Self Care | Payer: BC Managed Care – PPO | Source: Ambulatory Visit | Attending: Surgery | Admitting: Surgery

## 2019-08-31 ENCOUNTER — Other Ambulatory Visit: Payer: Self-pay

## 2019-08-31 DIAGNOSIS — Z01812 Encounter for preprocedural laboratory examination: Secondary | ICD-10-CM | POA: Insufficient documentation

## 2019-08-31 DIAGNOSIS — Z20822 Contact with and (suspected) exposure to covid-19: Secondary | ICD-10-CM | POA: Insufficient documentation

## 2019-08-31 LAB — SARS CORONAVIRUS 2 (TAT 6-24 HRS): SARS Coronavirus 2: NEGATIVE

## 2019-09-04 ENCOUNTER — Encounter: Payer: Self-pay | Admitting: Surgery

## 2019-09-04 ENCOUNTER — Ambulatory Visit: Payer: BC Managed Care – PPO | Admitting: Anesthesiology

## 2019-09-04 ENCOUNTER — Other Ambulatory Visit: Payer: Self-pay

## 2019-09-04 ENCOUNTER — Encounter
Admission: RE | Disposition: A | Discharge status: Home / Self Care | Payer: Self-pay | Source: Home / Self Care | Attending: Surgery

## 2019-09-04 ENCOUNTER — Ambulatory Visit
Admission: RE | Admit: 2019-09-04 | Discharge: 2019-09-04 | Disposition: A | Discharge status: Home / Self Care | Payer: BC Managed Care – PPO | Attending: Surgery | Admitting: Surgery

## 2019-09-04 ENCOUNTER — Ambulatory Visit: Payer: BC Managed Care – PPO

## 2019-09-04 DIAGNOSIS — Z7982 Long term (current) use of aspirin: Secondary | ICD-10-CM | POA: Insufficient documentation

## 2019-09-04 DIAGNOSIS — F17200 Nicotine dependence, unspecified, uncomplicated: Secondary | ICD-10-CM | POA: Diagnosis not present

## 2019-09-04 DIAGNOSIS — M1712 Unilateral primary osteoarthritis, left knee: Secondary | ICD-10-CM | POA: Diagnosis present

## 2019-09-04 DIAGNOSIS — Z79899 Other long term (current) drug therapy: Secondary | ICD-10-CM | POA: Diagnosis not present

## 2019-09-04 DIAGNOSIS — N189 Chronic kidney disease, unspecified: Secondary | ICD-10-CM | POA: Diagnosis not present

## 2019-09-04 DIAGNOSIS — Z7951 Long term (current) use of inhaled steroids: Secondary | ICD-10-CM | POA: Diagnosis not present

## 2019-09-04 DIAGNOSIS — I129 Hypertensive chronic kidney disease with stage 1 through stage 4 chronic kidney disease, or unspecified chronic kidney disease: Secondary | ICD-10-CM | POA: Insufficient documentation

## 2019-09-04 DIAGNOSIS — K219 Gastro-esophageal reflux disease without esophagitis: Secondary | ICD-10-CM | POA: Insufficient documentation

## 2019-09-04 DIAGNOSIS — S83232A Complex tear of medial meniscus, current injury, left knee, initial encounter: Secondary | ICD-10-CM | POA: Diagnosis not present

## 2019-09-04 DIAGNOSIS — E1140 Type 2 diabetes mellitus with diabetic neuropathy, unspecified: Secondary | ICD-10-CM | POA: Insufficient documentation

## 2019-09-04 DIAGNOSIS — Z96652 Presence of left artificial knee joint: Secondary | ICD-10-CM

## 2019-09-04 DIAGNOSIS — X58XXXA Exposure to other specified factors, initial encounter: Secondary | ICD-10-CM | POA: Insufficient documentation

## 2019-09-04 DIAGNOSIS — Z888 Allergy status to other drugs, medicaments and biological substances status: Secondary | ICD-10-CM | POA: Insufficient documentation

## 2019-09-04 DIAGNOSIS — Z7984 Long term (current) use of oral hypoglycemic drugs: Secondary | ICD-10-CM | POA: Diagnosis not present

## 2019-09-04 DIAGNOSIS — E1122 Type 2 diabetes mellitus with diabetic chronic kidney disease: Secondary | ICD-10-CM | POA: Insufficient documentation

## 2019-09-04 DIAGNOSIS — M25762 Osteophyte, left knee: Secondary | ICD-10-CM | POA: Diagnosis not present

## 2019-09-04 HISTORY — PX: PARTIAL KNEE ARTHROPLASTY: SHX2174

## 2019-09-04 LAB — GLUCOSE, CAPILLARY
Glucose-Capillary: 118 mg/dL — ABNORMAL HIGH (ref 70–99)
Glucose-Capillary: 117 mg/dL — ABNORMAL HIGH (ref 70–99)

## 2019-09-04 SURGERY — ARTHROPLASTY, KNEE, UNICOMPARTMENTAL
Anesthesia: Spinal | Site: Knee | Laterality: Left

## 2019-09-04 MED ORDER — TRANEXAMIC ACID 1000 MG/10ML IV SOLN
INTRAVENOUS | Status: AC
  Filled 2019-09-04: qty 10

## 2019-09-04 MED ORDER — CEFAZOLIN SODIUM-DEXTROSE 2-4 GM/100ML-% IV SOLN
INTRAVENOUS | Status: AC
  Filled 2019-09-04: qty 100

## 2019-09-04 MED ORDER — CEFAZOLIN SODIUM-DEXTROSE 2-4 GM/100ML-% IV SOLN
INTRAVENOUS | Status: AC
  Administered 2019-09-04: 2 g via INTRAVENOUS
  Filled 2019-09-04: qty 100

## 2019-09-04 MED ORDER — KETOROLAC TROMETHAMINE 15 MG/ML IJ SOLN
15.0000 mg | Freq: Once | INTRAMUSCULAR | Status: AC
  Administered 2019-09-04: 15 mg via INTRAVENOUS

## 2019-09-04 MED ORDER — BUPIVACAINE HCL (PF) 0.5 % IJ SOLN
INTRAMUSCULAR | Status: AC
  Filled 2019-09-04: qty 30

## 2019-09-04 MED ORDER — ORAL CARE MOUTH RINSE: 15.0000 mL | Freq: Once | OROMUCOSAL | Status: AC

## 2019-09-04 MED ORDER — ONDANSETRON HCL 4 MG/2ML IJ SOLN: 4.0000 mg | Freq: Once | INTRAMUSCULAR | Status: DC | PRN

## 2019-09-04 MED ORDER — PROPOFOL 500 MG/50ML IV EMUL
INTRAVENOUS | Status: AC
  Filled 2019-09-04: qty 50

## 2019-09-04 MED ORDER — SODIUM CHLORIDE 0.9 % IV BOLUS
250.0000 mL | Freq: Once | INTRAVENOUS | Status: AC
  Administered 2019-09-04: 250 mL via INTRAVENOUS

## 2019-09-04 MED ORDER — SODIUM CHLORIDE FLUSH 0.9 % IV SOLN
INTRAVENOUS | Status: AC
  Filled 2019-09-04: qty 40

## 2019-09-04 MED ORDER — CHLORHEXIDINE GLUCONATE 0.12 % MT SOLN
OROMUCOSAL | Status: AC
  Administered 2019-09-04: 15 mL via OROMUCOSAL
  Filled 2019-09-04: qty 15

## 2019-09-04 MED ORDER — ONDANSETRON HCL 4 MG PO TABS: 4.0000 mg | ORAL_TABLET | Freq: Four times a day (QID) | ORAL | Status: DC | PRN

## 2019-09-04 MED ORDER — PHENYLEPHRINE HCL (PRESSORS) 10 MG/ML IV SOLN
INTRAVENOUS | Status: DC | PRN
  Administered 2019-09-04 (×4): 100 ug via INTRAVENOUS

## 2019-09-04 MED ORDER — BUPIVACAINE LIPOSOME 1.3 % IJ SUSP
INTRAMUSCULAR | Status: AC
  Filled 2019-09-04: qty 20

## 2019-09-04 MED ORDER — ACETAMINOPHEN 10 MG/ML IV SOLN
INTRAVENOUS | Status: DC | PRN
  Administered 2019-09-04: 1000 mg via INTRAVENOUS

## 2019-09-04 MED ORDER — FENTANYL CITRATE (PF) 100 MCG/2ML IJ SOLN: 25.0000 ug | INTRAMUSCULAR | Status: DC | PRN

## 2019-09-04 MED ORDER — ACETAMINOPHEN 500 MG PO TABS
1000.0000 mg | ORAL_TABLET | Freq: Four times a day (QID) | ORAL | Status: DC
  Administered 2019-09-04: 1000 mg via ORAL

## 2019-09-04 MED ORDER — KETOROLAC TROMETHAMINE 15 MG/ML IJ SOLN: 7.5000 mg | Freq: Four times a day (QID) | INTRAMUSCULAR | Status: DC

## 2019-09-04 MED ORDER — PROPOFOL 10 MG/ML IV BOLUS
INTRAVENOUS | Status: DC | PRN
  Administered 2019-09-04: 50 mg via INTRAVENOUS

## 2019-09-04 MED ORDER — ONDANSETRON HCL 4 MG/2ML IJ SOLN
INTRAMUSCULAR | Status: AC
  Filled 2019-09-04: qty 2

## 2019-09-04 MED ORDER — BUPIVACAINE HCL (PF) 0.5 % IJ SOLN
INTRAMUSCULAR | Status: AC
  Filled 2019-09-04: qty 10

## 2019-09-04 MED ORDER — CEFAZOLIN SODIUM-DEXTROSE 2-4 GM/100ML-% IV SOLN
2.0000 g | Freq: Four times a day (QID) | INTRAVENOUS | Status: DC
Start: 2019-09-04 — End: 2019-09-04

## 2019-09-04 MED ORDER — OXYCODONE HCL 5 MG/5ML PO SOLN: 5.0000 mg | Freq: Once | ORAL | Status: DC | PRN

## 2019-09-04 MED ORDER — METOCLOPRAMIDE HCL 5 MG/ML IJ SOLN: 5.0000 mg | Freq: Three times a day (TID) | INTRAMUSCULAR | Status: DC | PRN

## 2019-09-04 MED ORDER — EPINEPHRINE PF 1 MG/ML IJ SOLN
INTRAMUSCULAR | Status: AC
  Filled 2019-09-04: qty 1

## 2019-09-04 MED ORDER — ACETAMINOPHEN 500 MG PO TABS
ORAL_TABLET | ORAL | Status: AC
  Filled 2019-09-04: qty 2

## 2019-09-04 MED ORDER — SODIUM CHLORIDE 0.9 % IV SOLN: INTRAVENOUS | Status: DC

## 2019-09-04 MED ORDER — APIXABAN 2.5 MG PO TABS
2.5000 mg | ORAL_TABLET | Freq: Two times a day (BID) | ORAL | 0 refills | Status: AC
Start: 2019-09-04 — End: ?

## 2019-09-04 MED ORDER — DEXMEDETOMIDINE HCL IN NACL 80 MCG/20ML IV SOLN
INTRAVENOUS | Status: AC
  Filled 2019-09-04: qty 20

## 2019-09-04 MED ORDER — ACETAMINOPHEN 10 MG/ML IV SOLN
INTRAVENOUS | Status: AC
  Filled 2019-09-04: qty 100

## 2019-09-04 MED ORDER — PROPOFOL 500 MG/50ML IV EMUL
INTRAVENOUS | Status: DC | PRN
  Administered 2019-09-04: 100 ug/kg/min via INTRAVENOUS

## 2019-09-04 MED ORDER — BUPIVACAINE HCL (PF) 0.5 % IJ SOLN
INTRAMUSCULAR | Status: DC | PRN
  Administered 2019-09-04: 2.5 mL

## 2019-09-04 MED ORDER — OXYCODONE HCL 5 MG PO TABS: 5.0000 mg | ORAL_TABLET | ORAL | 0 refills | Status: AC | PRN

## 2019-09-04 MED ORDER — TRANEXAMIC ACID 1000 MG/10ML IV SOLN
INTRAVENOUS | Status: DC | PRN
  Administered 2019-09-04: 1000 mg via TOPICAL

## 2019-09-04 MED ORDER — LIDOCAINE HCL (PF) 2 % IJ SOLN
INTRAMUSCULAR | Status: AC
  Filled 2019-09-04: qty 5

## 2019-09-04 MED ORDER — KETOROLAC TROMETHAMINE 15 MG/ML IJ SOLN
INTRAMUSCULAR | Status: AC
  Filled 2019-09-04: qty 1

## 2019-09-04 MED ORDER — DEXMEDETOMIDINE HCL 200 MCG/2ML IV SOLN
INTRAVENOUS | Status: DC | PRN
  Administered 2019-09-04: 12 ug via INTRAVENOUS
  Administered 2019-09-04: 8 ug via INTRAVENOUS

## 2019-09-04 MED ORDER — BUPIVACAINE LIPOSOME 1.3 % IJ SUSP
INTRAMUSCULAR | Status: DC | PRN
  Administered 2019-09-04: 20 mL

## 2019-09-04 MED ORDER — METOCLOPRAMIDE HCL 10 MG PO TABS: 5.0000 mg | ORAL_TABLET | Freq: Three times a day (TID) | ORAL | Status: DC | PRN

## 2019-09-04 MED ORDER — EPHEDRINE SULFATE 50 MG/ML IJ SOLN
INTRAMUSCULAR | Status: DC | PRN
  Administered 2019-09-04: 10 mg via INTRAVENOUS

## 2019-09-04 MED ORDER — OXYCODONE HCL 5 MG PO TABS
ORAL_TABLET | ORAL | Status: AC
  Filled 2019-09-04: qty 2

## 2019-09-04 MED ORDER — CHLORHEXIDINE GLUCONATE 0.12 % MT SOLN: 15.0000 mL | Freq: Once | OROMUCOSAL | Status: AC

## 2019-09-04 MED ORDER — CEFAZOLIN SODIUM-DEXTROSE 2-4 GM/100ML-% IV SOLN
2.0000 g | INTRAVENOUS | Status: AC
  Administered 2019-09-04: 2 g via INTRAVENOUS

## 2019-09-04 MED ORDER — OXYCODONE HCL 5 MG PO TABS: 5.0000 mg | ORAL_TABLET | Freq: Once | ORAL | Status: DC | PRN

## 2019-09-04 MED ORDER — ONDANSETRON HCL 4 MG/2ML IJ SOLN
4.0000 mg | Freq: Four times a day (QID) | INTRAMUSCULAR | Status: DC | PRN
  Administered 2019-09-04: 4 mg via INTRAVENOUS

## 2019-09-04 MED ORDER — BUPIVACAINE-EPINEPHRINE (PF) 0.5% -1:200000 IJ SOLN
INTRAMUSCULAR | Status: DC | PRN
  Administered 2019-09-04: 30 mL via PERINEURAL

## 2019-09-04 MED ORDER — SODIUM CHLORIDE 0.9 % IV SOLN
INTRAVENOUS | Status: DC | PRN
  Administered 2019-09-04: 20 ug/min via INTRAVENOUS

## 2019-09-04 MED ORDER — SODIUM CHLORIDE 0.9 % IV BOLUS
500.0000 mL | Freq: Once | INTRAVENOUS | Status: AC
  Administered 2019-09-04: 500 mL via INTRAVENOUS

## 2019-09-04 MED ORDER — OXYCODONE HCL 5 MG PO TABS
5.0000 mg | ORAL_TABLET | ORAL | Status: DC | PRN
  Administered 2019-09-04: 10 mg via ORAL
  Filled 2019-09-04: qty 2

## 2019-09-04 MED ORDER — POTASSIUM CHLORIDE IN NACL 20-0.9 MEQ/L-% IV SOLN
INTRAVENOUS | Status: DC
Start: 2019-09-04 — End: 2019-09-04

## 2019-09-04 SURGICAL SUPPLY — 65 items
BNDG ELASTIC 6X5.8 VLCR STR LF (GAUZE/BANDAGES/DRESSINGS) ×4 IMPLANT
CANISTER SUCT 1200ML W/VALVE (MISCELLANEOUS) ×2 IMPLANT
CANISTER SUCT 3000ML PPV (MISCELLANEOUS) ×2 IMPLANT
CEMENT BONE R 1X40 (Cement) ×2 IMPLANT
CEMENT VACUUM MIXING SYSTEM (MISCELLANEOUS) ×2 IMPLANT
CHLORAPREP W/TINT 26 (MISCELLANEOUS) ×2 IMPLANT
COOLER POLAR GLACIER W/PUMP (MISCELLANEOUS) ×2 IMPLANT
COVER MAYO STAND REUSABLE (DRAPES) ×2 IMPLANT
COVER WAND RF STERILE (DRAPES) ×2 IMPLANT
CUFF TOURN SGL QUICK 24 (TOURNIQUET CUFF) ×1
CUFF TOURN SGL QUICK 30 (TOURNIQUET CUFF)
CUFF TOURN SGL QUICK 34 (TOURNIQUET CUFF)
CUFF TRNQT CYL 24X4X16.5-23 (TOURNIQUET CUFF) ×1 IMPLANT
CUFF TRNQT CYL 30X4X21-28X (TOURNIQUET CUFF) IMPLANT
CUFF TRNQT CYL 34X4.125X (TOURNIQUET CUFF) IMPLANT
DRAPE C-ARM XRAY 36X54 (DRAPES) ×2 IMPLANT
DRSG MEPILEX SACRM 8.7X9.8 (GAUZE/BANDAGES/DRESSINGS) ×2 IMPLANT
DRSG OPSITE POSTOP 4X12 (GAUZE/BANDAGES/DRESSINGS) IMPLANT
DRSG OPSITE POSTOP 4X6 (GAUZE/BANDAGES/DRESSINGS) ×2 IMPLANT
ELECT CAUTERY BLADE 6.4 (BLADE) ×2 IMPLANT
ELECT REM PT RETURN 9FT ADLT (ELECTROSURGICAL) ×2
ELECTRODE REM PT RTRN 9FT ADLT (ELECTROSURGICAL) ×1 IMPLANT
GAUZE 4X4 16PLY RFD (DISPOSABLE) IMPLANT
GAUZE SPONGE 4X4 12PLY STRL (GAUZE/BANDAGES/DRESSINGS) IMPLANT
GAUZE XEROFORM 1X8 LF (GAUZE/BANDAGES/DRESSINGS) ×2 IMPLANT
GLOVE BIO SURGEON STRL SZ7.5 (GLOVE) ×8 IMPLANT
GLOVE BIO SURGEON STRL SZ8 (GLOVE) ×8 IMPLANT
GLOVE BIOGEL PI IND STRL 8 (GLOVE) ×1 IMPLANT
GLOVE BIOGEL PI INDICATOR 8 (GLOVE) ×1
GLOVE INDICATOR 8.0 STRL GRN (GLOVE) ×2 IMPLANT
GOWN STRL REUS W/ TWL LRG LVL3 (GOWN DISPOSABLE) ×1 IMPLANT
GOWN STRL REUS W/ TWL XL LVL3 (GOWN DISPOSABLE) ×1 IMPLANT
GOWN STRL REUS W/TWL LRG LVL3 (GOWN DISPOSABLE) ×1
GOWN STRL REUS W/TWL XL LVL3 (GOWN DISPOSABLE) ×1
HOOD PEEL AWAY FLYTE STAYCOOL (MISCELLANEOUS) ×6 IMPLANT
KIT TURNOVER KIT A (KITS) ×2 IMPLANT
KNEE PARTIAL CEMENT FEM XS (Miscellaneous) ×2 IMPLANT
KNEE PARTIAL MENISCAL XS LT (Miscellaneous) ×2 IMPLANT
MAT ABSORB  FLUID 56X50 GRAY (MISCELLANEOUS)
MAT ABSORB FLUID 56X50 GRAY (MISCELLANEOUS) IMPLANT
NDL SAFETY ECLIPSE 18X1.5 (NEEDLE) ×1 IMPLANT
NEEDLE HYPO 18GX1.5 SHARP (NEEDLE) ×1
NEEDLE SPNL 20GX3.5 QUINCKE YW (NEEDLE) ×2 IMPLANT
NS IRRIG 1000ML POUR BTL (IV SOLUTION) ×2 IMPLANT
PACK BLADE SAW RECIP 70 3 PT (BLADE) ×2 IMPLANT
PACK TOTAL KNEE (MISCELLANEOUS) ×2 IMPLANT
PAD ABD DERMACEA PRESS 5X9 (GAUZE/BANDAGES/DRESSINGS) IMPLANT
PAD WRAPON POLAR KNEE (MISCELLANEOUS) ×1 IMPLANT
PULSAVAC PLUS IRRIG FAN TIP (DISPOSABLE) ×2
SOL .9 NS 3000ML IRR  AL (IV SOLUTION) ×1
SOL .9 NS 3000ML IRR UROMATIC (IV SOLUTION) ×1 IMPLANT
STAPLER SKIN PROX 35W (STAPLE) ×2 IMPLANT
STRAP SAFETY 5IN WIDE (MISCELLANEOUS) ×2 IMPLANT
SUCTION FRAZIER HANDLE 10FR (MISCELLANEOUS) ×1
SUCTION TUBE FRAZIER 10FR DISP (MISCELLANEOUS) ×1 IMPLANT
SUT VIC AB 0 CT1 36 (SUTURE) ×2 IMPLANT
SUT VIC AB 2-0 CT1 27 (SUTURE) ×4
SUT VIC AB 2-0 CT1 TAPERPNT 27 (SUTURE) ×4 IMPLANT
SYR 10ML LL (SYRINGE) ×2 IMPLANT
SYR 20ML LL LF (SYRINGE) ×2 IMPLANT
SYR 30ML LL (SYRINGE) ×6 IMPLANT
TAPE TRANSPORE STRL 2 31045 (GAUZE/BANDAGES/DRESSINGS) ×2 IMPLANT
TIP FAN IRRIG PULSAVAC PLUS (DISPOSABLE) ×1 IMPLANT
TRAY TIBIAL KNEE OXFORD STD AA (Joint) ×2 IMPLANT
WRAPON POLAR PAD KNEE (MISCELLANEOUS) ×2

## 2019-09-04 NOTE — Op Note (Signed)
09/04/2019  9:45 AM  Patient:   Erica Cunningham  Pre-Op Diagnosis:   Osteoarthritis of medial compartment, left knee.  Post-Op Diagnosis:   Same  Procedure:   Left unicondylar knee arthroplasty.  Surgeon:   Pascal Lux, MD  Assistant:   Kirkland Hun, PA-S  Anesthesia:   Spinal  Findings:   As above.  Complications:   None  EBL:   5 cc  Fluids:   600 cc crystalloid  UOP:   None  TT:   82 minutes at 300 mmHg  Drains:   None  Closure:   Staples  Implants:   All-cemented Biomet [place] system with an XS femoral component, an "AA" sized tibial tray, and a 3 mm meniscal bearing insert.  Brief Clinical Note:   The patient is a 64 year old female with a history of progressively worsening medial sided right knee pain. Her symptoms have progressed despite medications, activity modification, etc. Her history and examination consistent with degenerative joint disease isolated to the medial compartment and a medial meniscus tear, both of which were confirmed by MRI scan. The patient presents at this time for a left partial knee replacement.  Procedure:   The patient was brought into the operating room. A spinal placed by the anesthesiologist before the patient was lain in the supine position. The patient was repositioned so that the non-surgical leg was placed in a flexed and abducted position in the yellow fin leg holder while the surgical extremity was placed over the Biomet leg holder. The left lower extremity was prepped with ChloraPrep solution before being draped sterilely. Preoperative antibiotics were administered. After performing a timeout to verify the appropriate surgical site, the limb was exsanguinated with an Esmarch and the tourniquet inflated to 300 mmHg.   A standard anterior approach to the knee was made through an approximately 3.5-4 inch incision. The incision was carried down through the subcutaneous tissues to expose the superficial retinaculum. This was split the  length the incision and the medial flap elevated sufficiently to expose the medial retinaculum. The medial retinaculum was incised along the medial border of the patella tendon and extended proximally along the medial border of the patella, leaving a 3-4 mm cuff of tissue. The soft tissues were elevated off the anteromedial aspect of the proximal tibia. The anterior portion of the meniscus was removed after performing a subtotal excision of the infrapatellar fat pad. The anterior cruciate ligament was inspected and found to be in excellent condition. Osteophytes were removed from the inferior pole of the patella as well as from the notch using a quarter-inch osteotome. There were significant degenerative changes of both the femur and tibia on the medial side. The medial femoral condyle was sized using the small and medium sizers. It was felt that the extra small guide best optimized the contour of the femur. This was left in place and the external tibial guide positioned. The coupling device was used to connect the guide to the medial femoral condylar sizer to optimize appropriate orientation. Two guide pins were inserted into the cutting block before the coupling device and sizer were removed. The appropriate tibial cut was made using the oscillating and reciprocating saws. The piece was removed in its entirety and taken to the back table where it was sized and found to be optimally replicated by an "AA" sized component. The 8 mm spacer was inserted to verify that sufficient bone had been removed.  Attention was directed to femoral side. The intramedullary canal was accessed  through a 4 mm drill hole. The intramedullary guide was positioned before the guide for the femoral condylar holes was positioned. The appropriate coupling device connected this guide to the intramedullary guide before both drill holes were created in the distal aspect of the medial femoral condyle. The devices were removed and the posterior  condylar cutting block inserted. The appropriate cut was made using the reciprocating saw and this piece removed. The #0 spigot was inserted and the initial bone milling performed. A trial femoral component was inserted and both the flexion and extension gaps measured. In flexion, the gap measured 7 mm whereas in extension, it measured 3 mm. Therefore, the #4 spigot was selected and the secondary bone milling performed. Repeat sizing demonstrated symmetric flexion and extension gaps. The bone was removed from the postero-medial and postero-lateral aspects of the femoral condyle, as well as from the beneath the collar of the spigot. Bone also was removed from the anterior portion of the femur so as to minimize any potential impingement with the meniscal bearing insert. The trial components removed and several drill holes placed into the distal femoral condyle to further augment cement fixation.  Attention was redirected to the tibial side. The "AA" sized tibial tray was positioned and temporarily secured using the appropriate spiked nail. The keel was created using the bi-bladed reciprocating saw and hoe. The keeled "AA" sized trial tibial tray was inserted to be sure that it seated properly. At this point, a total of 20 cc of Exparel diluted out to 40 cc with normal saline and 30 cc of 0.5% Sensorcaine was injected in and around the posterior and medial capsular tissues, as well as the peri-incisional tissues to help with postoperative pain control.  The bony surfaces were prepared for cementing by irrigating them thoroughly with bacitracin saline solution using the jet lavage system before packing them with a dry sponge. Meanwhile, cement was being mixed on the back table. When the cement was ready, the tibial tray was cemented in first. The excess cement was removed using a Surveyor, quantity after impacting it into place. Next, the femoral component was impacted into place. Again the excess cement was removed  using a Surveyor, quantity. The 4 mm spacer was inserted and the knee brought into near full extension while the cement hardened. Once the cement hardened, the spacer was removed and the 3 mm meniscal bearing insert was trialed. This demonstrated excellent tracking while the knee was placed through a range of motion, and showed no evidence towards subluxation or dislocation. In addition, it did not fit too tightly. Therefore, the permanent 3 mm meniscal bearing insert was snapped into position after verifying that no cement had been retained posteriorly. Again the knee was placed through a range of motion with the findings as described above.  The wound was copiously irrigated with sterile saline solution via the jet lavage system before the retinacular layer was reapproximated using #0 Vicryl interrupted sutures. At this point, 1 g of transexemic acid in 10 cc of normal saline was injected intra-articularly. The subcutaneous tissues were closed in two layers using 2-0 Vicryl interrupted sutures before the skin was closed using staples. A sterile occlusive dressing was applied to the knee before the patient was awakened. The patient was transferred back to her hospital bed and returned to the recovery room in satisfactory condition after tolerating the procedure well. A Polar Care device was applied to the knee as well.

## 2019-09-04 NOTE — Anesthesia Procedure Notes (Signed)
Spinal ° °Patient location during procedure: OR °End time: 09/04/2019 7:38 AM °Staffing °Performed: resident/CRNA  °Anesthesiologist: Ramsdell, Kathryn F, MD °Resident/CRNA: Weatherly, Janice, CRNA °Preanesthetic Checklist °Completed: patient identified, IV checked, site marked, risks and benefits discussed, surgical consent, monitors and equipment checked, pre-op evaluation and timeout performed °Spinal Block °Patient position: sitting °Prep: Betadine °Patient monitoring: heart rate, continuous pulse ox, blood pressure and cardiac monitor °Approach: midline °Location: L4-5 °Injection technique: single-shot °Needle °Needle type: Whitacre and Introducer  °Needle gauge: 24 G °Needle length: 9 cm °Additional Notes °Negative paresthesia. Negative blood return. Positive free-flowing CSF. Expiration date of kit checked and confirmed. Patient tolerated procedure well, without complications. ° ° ° ° ° ° °

## 2019-09-04 NOTE — Transfer of Care (Signed)
Immediate Anesthesia Transfer of Care Note  Patient: Erica Cunningham  Procedure(s) Performed: LEFT UNICOMPARTMENTAL KNEE (Left Knee)  Patient Location: PACU  Anesthesia Type:Spinal  Level of Consciousness: drowsy and patient cooperative  Airway & Oxygen Therapy: Patient Spontanous Breathing and Patient connected to nasal cannula oxygen  Post-op Assessment: Report given to RN and Post -op Vital signs reviewed and stable  Post vital signs: Reviewed and stable  Last Vitals:  Vitals Value Taken Time  BP 75/49 09/04/19 0947  Temp 36 C 09/04/19 0947  Pulse 78 09/04/19 0950  Resp 18 09/04/19 0950  SpO2 95 % 09/04/19 0950  Vitals shown include unvalidated device data.  Last Pain:  Vitals:   09/04/19 4401  TempSrc: Tympanic  PainSc: 4          Complications: No complications documented.

## 2019-09-04 NOTE — H&P (Signed)
History of Present Illness: Erica Cunningham is a 64 y.o. female who presents today for her surgical history and physical for her upcoming left partial knee arthroplasty. Surgery scheduled with Dr. Roland Rack on 09/04/2019. The patient denies any changes in her medical history since she was last evaluated. She denies any numbness or tingling to the left lower extremity. She denies any trauma or injury since she was last evaluated. No personal history of heart attack, blood clot, stroke, asthma or COPD. Pain score in the left knee at today's visit is a 4 out of 10.  Past Medical History: . Arthritis  . Cancer (CMS-HCC)  basal cell  . Diabetes mellitus type 2, uncomplicated (CMS-HCC)  . GERD (gastroesophageal reflux disease)  diet controlled  . Hypertension  . PONV (postoperative nausea and vomiting)   Past Surgical History: . BREAST SURGERY  right breast biopsy local  . CHOLECYSTECTOMY 03/2017  . EYE SURGERY  lasik  . HYSTERECTOMY  . LITHOTRIPSY ESWL Right 01/11/2014  Procedure: Right Ureteral Extracorporeal Shock Wave Lithotripsy (7:30); Surgeon: Ranae Palms, MD; Location: [facility] OR; Service: Urology; Laterality: Right;  . SKIN BIOPSY  basal cell   Past Family History: History reviewed. No pertinent family history.  Medications: . albuterol 90 mcg/actuation inhaler Inhale into the lungs.  Marland Kitchen aspirin 81 MG EC tablet Take 81 mg by mouth once daily  . atorvastatin (LIPITOR) 40 MG tablet Take 40 mg by mouth once daily.  . chlorthalidone 50 MG tablet Take 100 mg by mouth once daily.  Marland Kitchen diltiazem (CARDIZEM CD) 120 MG XR capsule Take 120 mg by mouth once daily.  . fluticasone propionate (FLOVENT HFA) 220 mcg/actuation inhaler Inhale into the lungs Inhale 2 puffs into the lungs daily as needed (shortness of breath).  Marland Kitchen LANCETS & BLOOD GLUCOSE STRIPS MISC Use.  . losartan (COZAAR) 100 MG tablet Take 100 mg by mouth once daily.  . magnesium oxide (MAG-OX) 400 mg (241.3 mg magnesium) tablet  Take by mouth Take 400 mg by mouth 2 (two) times daily.  . metFORMIN (GLUCOPHAGE-XR) 500 MG XR tablet TAKE 2 TABLETS BY MOUTH ONCE DAILY WITH EVENING MEALS  . omeprazole (PRILOSEC) 40 MG DR capsule Take 40 mg by mouth once daily  . potassium chloride (KLOR-CON) 10 MEQ ER tablet Take by mouth Take 10 mEq by mouth 2 (two) times daily.  . potassium citrate (UROCIT-K) 15 mEq ER tablet Take 15 mEq by mouth 2 (two) times daily  . semaglutide (OZEMPIC) 0.25 mg or 0.5 mg(2 mg/1.5 mL) pen injector Inject subcutaneously Inject 0.5 mg into the skin once a week. Takes on Sunday   No current Epic-ordered facility-administered medications on file.   Allergies: . Ace Inhibitors Other (See Comments)  cough  Review of Systems:  A comprehensive 14 point ROS was performed, reviewed by me today, and the pertinent orthopaedic findings are documented in the HPI.  Physical Exam: BP 120/78  Ht 160 cm (5\' 3" )  Wt 65.4 kg (144 lb 3.2 oz)  BMI 25.54 kg/m  General/Constitutional: The patient appears to be well-nourished, well-developed, and in no acute distress. Neuro/Psych: Normal mood and affect, oriented to person, place and time. Eyes: Non-icteric. Pupils are equal, round, and reactive to light, and exhibit synchronous movement. ENT: Unremarkable. Lymphatic: No palpable adenopathy. Respiratory: Lungs clear to auscultation, Normal chest excursion, No wheezes and Non-labored breathing Cardiovascular: Regular rate and rhythm. No murmurs. and No edema, swelling or tenderness, except as noted in detailed exam. Integumentary: No impressive skin lesions present,  except as noted in detailed exam. Musculoskeletal: Unremarkable, except as noted in detailed exam.  Leftknee exam: GAIT:Mild limp, butuses no assistive devices. ALIGNMENT:Normal SKIN:Unremarkable SWELLING:Absent EFFUSION:Trace WARMTH:None TENDERNESS:Mild-moderatetenderness alongmedial joint line, minimal tenderness posteriorly ROM:0-125  degrees with mild soreness in maximal flexion McMURRAY'S:Mildly positive PATELLOFEMORAL:Normal tracking with no peri-patellar tenderness and negative apprehension sign CREPITUS:None LACHMAN'S:Negative PIVOT SHIFT:Negative ANTERIOR DRAWER:Negative POSTERIOR DRAWER:Negative VARUS/VALGUS:Stable  She is neurovascularly intact to the left lower extremity and foot.  X-rays/MRI/Lab data:  A recent MRI scan of the left knee is available for review. By report, the scan demonstrates evidence of a posterior root tear of the medial meniscus with medial extrusion. There also is evidence of degenerative changes with a horizontal tear involving the posterior portion of the medial meniscus. Significant degenerative changes of the medial compartment are noted with full-thickness articular cartilage loss on both the femoral and tibial weightbearing surfaces as well as subchondral edema in these areas. The lateral and patellofemoral compartments appear to be well-maintained. No ligamentous pathology is noted.   Impression: Complex tear of medial meniscus of left knee. Primary osteoarthritis of left knee.  Plan:  1. Treatment options were discussed today with the patient. 2. The patient is scheduled for a left partial knee arthroplasty with Dr. Roland Rack on 09/04/2019. 3. The patient was instructed on the risk and benefits of surgery and wishes to proceed at this time. 4. This document will serve as a surgical history and physical for the patient. 5. The patient will follow-up per standard postop protocol. They can call the clinic they have any questions, new symptoms develop or symptoms worsen.  The procedure was discussed with the patient, as were the potential risks (including bleeding, infection, nerve and/or blood vessel injury, persistent or recurrent pain, failure of the repair, progression of arthritis, need for further surgery, blood clots, strokes, heart attacks and/or arhythmias, pneumonia,  etc.) and benefits. The patient states her understanding and wishes to proceed.   H&P reviewed and patient re-examined. No changes.

## 2019-09-04 NOTE — Discharge Instructions (Addendum)
AMBULATORY SURGERY  DISCHARGE INSTRUCTIONS   1) The drugs that you were given will stay in your system until tomorrow so for the next 24 hours you should not:  A) Drive an automobile B) Make any legal decisions C) Drink any alcoholic beverage   2) You may resume regular meals tomorrow.  Today it is better to start with liquids and gradually work up to solid foods.  You may eat anything you prefer, but it is better to start with liquids, then soup and crackers, and gradually work up to solid foods.   3) Please notify your doctor immediately if you have any unusual bleeding, trouble breathing, redness and pain at the surgery site, drainage, fever, or pain not relieved by medication.  4) Your post-operative visit with Dr.                                     is: Date:                        Time:    Please call to schedule your post-operative visit.  5) Additional Instructions: Orthopedic discharge instructions: May shower with intact Op-Site dressing.  Apply ice frequently to knee or use Polar Care. Take oxycodone as prescribed when needed.  May supplement with ES Tylenol if necessary. Begin Eliquis tomorrow morning for 15 days, then start ASA 325 mg for 4 weeks. May weight-bear as tolerated on left leg - use crutches or walker as needed. Follow-up in 10-14 days or as scheduled.

## 2019-09-04 NOTE — Anesthesia Preprocedure Evaluation (Addendum)
Anesthesia Evaluation  Patient identified by MRN, date of birth, ID band Patient awake    Reviewed: Allergy & Precautions, H&P , NPO status , Patient's Chart, lab work & pertinent test results  History of Anesthesia Complications (+) PONV  Airway Mallampati: I  TM Distance: >3 FB     Dental  (+) Teeth Intact   Pulmonary Current Smoker and Patient abstained from smoking.,    breath sounds clear to auscultation       Cardiovascular hypertension, (-) angina(-) Past MI (-) dysrhythmias  Rhythm:regular Rate:Normal     Neuro/Psych neuropathy negative psych ROS   GI/Hepatic Neg liver ROS, GERD  Controlled,  Endo/Other  diabetes  Renal/GU      Musculoskeletal   Abdominal   Peds  Hematology negative hematology ROS (+)   Anesthesia Other Findings Past Medical History: 07/2019: Acute medial meniscus tear of left knee No date: Arthritis No date: Cancer ([place])     Comment:  basal cell on arms and left eyebrow No date: Chronic kidney disease     Comment:  sees Dr. Simonne Come at [facility], [facility].  ckd d/t               diabetes No date: Complication of anesthesia No date: Diabetes mellitus without complication ([place]) No date: GERD (gastroesophageal reflux disease) No date: History of kidney stones No date: Hypertension No date: Neuromuscular disorder ([place])     Comment:  diabetic neuropathies No date: Pneumonia     Comment:  inhalers are for history of bronchitis, not pneumonia No date: PONV (postoperative nausea and vomiting)     Comment:  did okay with propofol for colonoscopy  Past Surgical History: No date: ABDOMINAL HYSTERECTOMY 2010: BREAST SURGERY; Right     Comment:  breast biopsy.  negative results 03/30/2017: CHOLECYSTECTOMY; N/A     Comment:  Procedure: LAPAROSCOPIC CHOLECYSTECTOMY;  Surgeon:               Clayburn Pert, MD;  Location: [facility] ORS;  Service:               General;  Laterality:  N/A; 08/01/2019: COLONOSCOPY No date: EYE SURGERY; Bilateral     Comment:  lasik     Reproductive/Obstetrics negative OB ROS                            Anesthesia Physical Anesthesia Plan  ASA: II  Anesthesia Plan: Spinal   Post-op Pain Management:    Induction:   PONV Risk Score and Plan: Propofol infusion  Airway Management Planned:   Additional Equipment:   Intra-op Plan:   Post-operative Plan:   Informed Consent: I have reviewed the patients History and Physical, chart, labs and discussed the procedure including the risks, benefits and alternatives for the proposed anesthesia with the patient or authorized representative who has indicated his/her understanding and acceptance.     Dental Advisory Given  Plan Discussed with: Anesthesiologist, CRNA and Surgeon  Anesthesia Plan Comments:         Anesthesia Quick Evaluation

## 2019-09-04 NOTE — Evaluation (Signed)
Physical Therapy Evaluation Patient Details Name: Erica Cunningham MRN: [id number] DOB: Nov 08, 1955 Today's Date: 09/04/2019   History of Present Illness  Pt admitted for L uni knee. History includes arthritis, DM, GERD, and HTN. Planning from same day discharge this date  Clinical Impression  Pt is a pleasant 64 year old female who was admitted for L uni knee s/p Sx this date. Pt performs bed mobility, transfers, and ambulation with cga and RW. Pt demonstrates ability to perform 10 SLRs with independence, therefore does not require KI for mobility. Pt demonstrates deficits with strength/pain/mobility. Discussed stair training. Pt and husband feel confident to dc this date. Would benefit from skilled PT to address above deficits and promote optimal return to PLOF. Recommend transition to [place] upon discharge from acute hospitalization.     Follow Up Recommendations Home health PT    Equipment Recommendations  None recommended by PT    Recommendations for Other Services       Precautions / Restrictions Precautions Precautions: Knee;Fall Precaution Booklet Issued: Yes (comment) Restrictions Weight Bearing Restrictions: Yes LLE Weight Bearing: Weight bearing as tolerated      Mobility  Bed Mobility Overal bed mobility: Needs Assistance Bed Mobility: Supine to Sit     Supine to sit: Min guard     General bed mobility comments: bed mobility performed with cga and safe technique. Once seated at EOB, able to demonstrate upright posture.  Transfers Overall transfer level: Needs assistance Equipment used: Rolling walker (2 wheeled) Transfers: Sit to/from Stand Sit to Stand: Min guard         General transfer comment: safe technique with upright posture. RW used  Ambulation/Gait Ambulation/Gait assistance: Counsellor (Feet): 130 Feet Assistive device: Rolling walker (2 wheeled) Gait Pattern/deviations: Step-through pattern     General Gait Details: ambulated  using RW demonstrating reciprocal gait pattern. Fatigues with increased ambulation with slight increase in pain  Stairs Stairs:  (discussed stair training and sequencing)          Wheelchair Mobility    Modified Rankin (Stroke Patients Only)       Balance Overall balance assessment: Modified Independent                                           Pertinent Vitals/Pain Pain Assessment: 0-10 Pain Score: 5  Pain Location: L knee Pain Descriptors / Indicators: Operative site guarding Pain Intervention(s): Limited activity within patient's tolerance;Ice applied;Repositioned    Home Living Family/patient expects to be discharged to:: Private residence Living Arrangements: Spouse/significant other Available Help at Discharge: Family;Available 24 hours/day Type of Home: House Home Access: Stairs to enter   [employer] of Steps: 7 in back with B railing and 3 in front without railing Home Layout: One level Home Equipment: Walker - 2 wheels      Prior Function Level of Independence: Independent         Comments: previously independent and very active     Hand Dominance        Extremity/Trunk Assessment   Upper Extremity Assessment Upper Extremity Assessment: Overall WFL for tasks assessed    Lower Extremity Assessment Lower Extremity Assessment: Generalized weakness (L knee grossly 3+/5)       Communication   Communication: No difficulties  Cognition Arousal/Alertness: Awake/alert Behavior During Therapy: WFL for tasks assessed/performed Overall Cognitive Status: Within Functional Limits for tasks assessed  General Comments      Exercises Total Joint Exercises Goniometric ROM: L LE AAROM: 8-73 degrees Other Exercises Other Exercises: supine ther-ex performed on L LE including AP, quad sets, SLRs, hip abd/add, and SAQ. All ther-ex performed x 10 reps with cga and cues  for safe technique. Reviewed ther0ex packet and discussed frequency and duration. Other Exercises: Reviewed home safety tips as well as education on polar care, self dressing items, and mobility progress. Other Exercises: ambulated to bathroom with supervision. Safe technique with hygiene.    Assessment/Plan    PT Assessment Patient needs continued PT services  PT Problem List Decreased strength;Decreased range of motion;Decreased balance;Decreased mobility;Decreased knowledge of use of DME;Pain       PT Treatment Interventions DME instruction;Gait training;Stair training;Therapeutic exercise;Balance training    PT Goals (Current goals can be found in the Care Plan section)  Acute Rehab PT Goals Patient Stated Goal: to go home PT Goal Formulation: With patient Time For Goal Achievement: 09/18/19 Potential to Achieve Goals: Good    Frequency BID   Barriers to discharge        Co-evaluation               AM-PAC PT "6 Clicks" Mobility  Outcome Measure Help needed turning from your back to your side while in a flat bed without using bedrails?: None Help needed moving from lying on your back to sitting on the side of a flat bed without using bedrails?: A Little Help needed moving to and from a bed to a chair (including a wheelchair)?: A Little Help needed standing up from a chair using your arms (e.g., wheelchair or bedside chair)?: A Little Help needed to walk in hospital room?: A Little Help needed climbing 3-5 steps with a railing? : A Little 6 Click Score: 19    End of Session Equipment Utilized During Treatment: Gait belt Activity Tolerance: Patient tolerated treatment well Patient left: in chair;with nursing/sitter in room Nurse Communication: Mobility status PT Visit Diagnosis: Muscle weakness (generalized) (M62.81);Difficulty in walking, not elsewhere classified (R26.2);Pain Pain - Right/Left: Left Pain - part of body: Knee    Time: 1441-1516 PT Time  Calculation (min) (ACUTE ONLY): 35 min   Charges:   PT Evaluation $PT Eval Moderate Complexity: 1 Mod PT Treatments $Therapeutic Exercise: 8-22 mins $Therapeutic Activity: 8-22 mins        Greggory Stallion, PT, DPT [phone]   Chayson Charters 09/04/2019, 4:36 PM

## 2019-09-05 NOTE — Anesthesia Postprocedure Evaluation (Signed)
Anesthesia Post Note  Patient: Erica Cunningham  Procedure(s) Performed: LEFT UNICOMPARTMENTAL KNEE (Left Knee)  Patient location during evaluation: PACU Anesthesia Type: Spinal Level of consciousness: awake and alert and oriented Pain management: pain level controlled Vital Signs Assessment: post-procedure vital signs reviewed and stable Respiratory status: respiratory function stable Cardiovascular status: stable Postop Assessment: spinal receding, patient able to bend at knees, no apparent nausea or vomiting, able to ambulate and adequate PO intake Comments: Pt stable in PACU before discharge to home   No complications documented.   Last Vitals:  Vitals:   09/04/19 1318 09/04/19 1526  BP: 102/62 98/72  Pulse: (!) 59 (!) 59  Resp: 16 18  Temp: (!) 36.1 C   SpO2: 95% 97%    Last Pain:  Vitals:   09/05/19 0818  TempSrc:   PainSc: 10-Worst pain ever                 Lanora Manis

## 2021-02-09 IMAGING — DX DG KNEE 1-2V PORT*L*
2 series · 2 of 2 positions shown · non-contrast
Comparison: Knee MRI 07/14/2019.

CLINICAL DATA: 63-year-old female status post left partial knee
replacement.

EXAM:
PORTABLE LEFT KNEE - 1-2 VIEW

[knee ap]
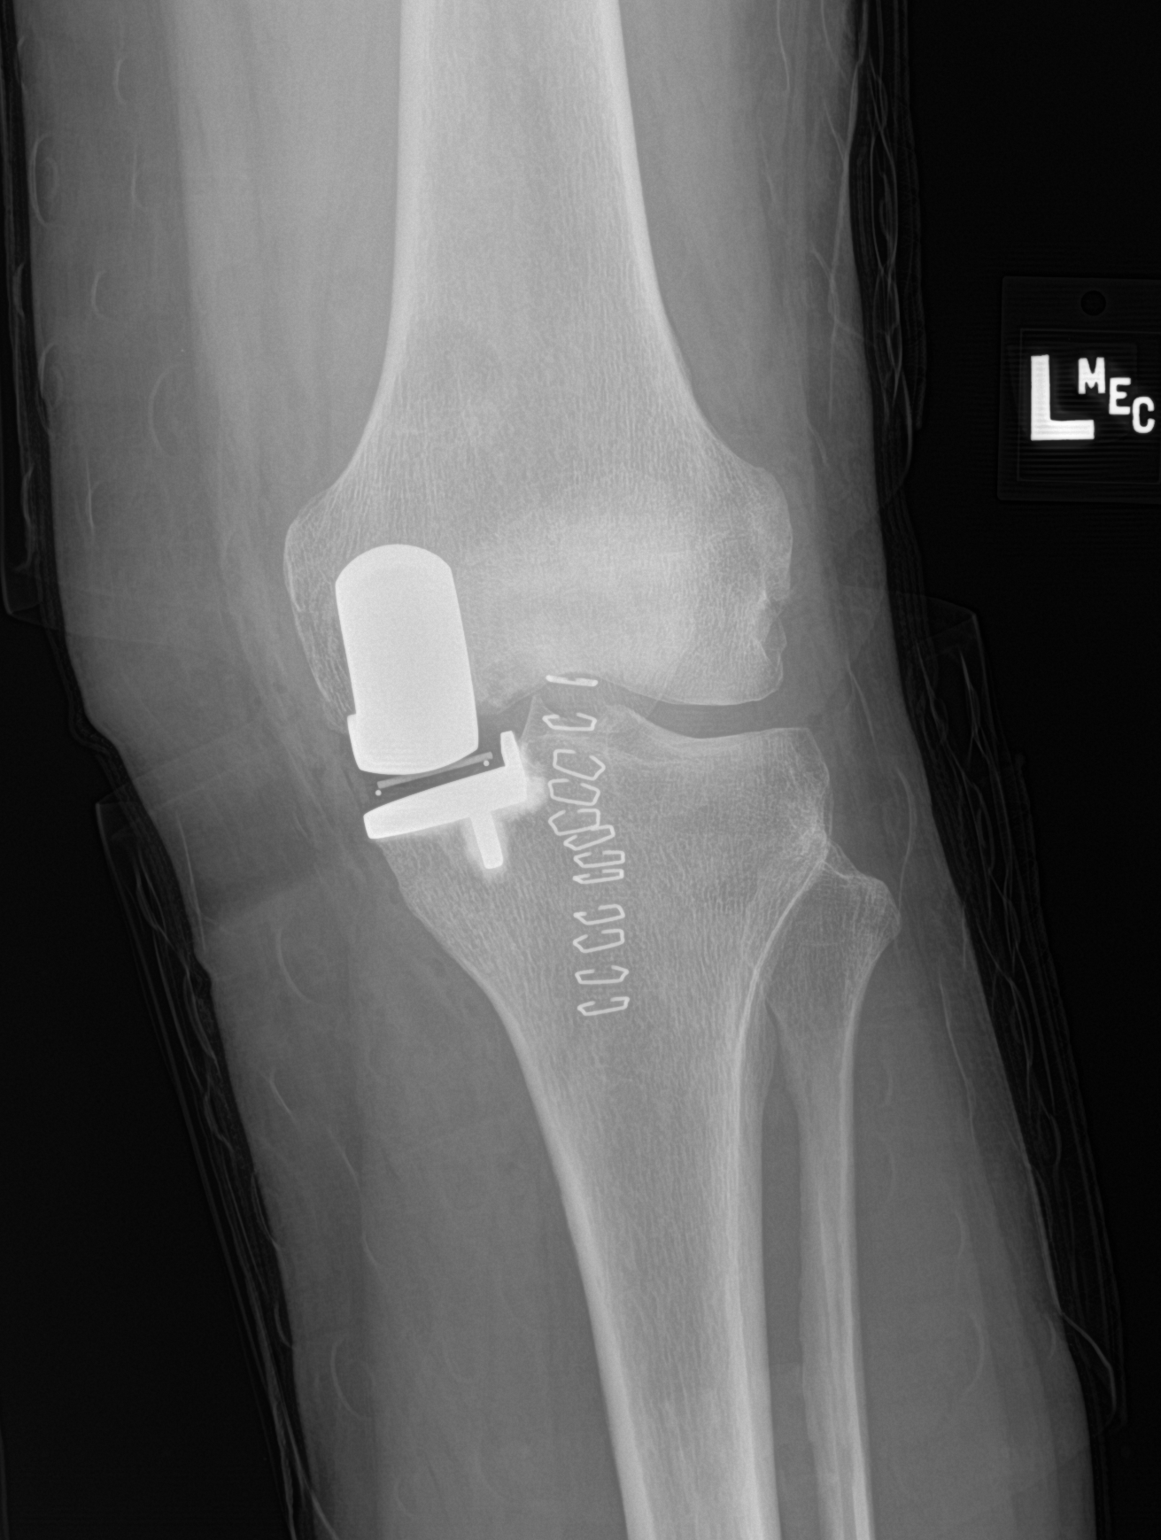

[knee lat]
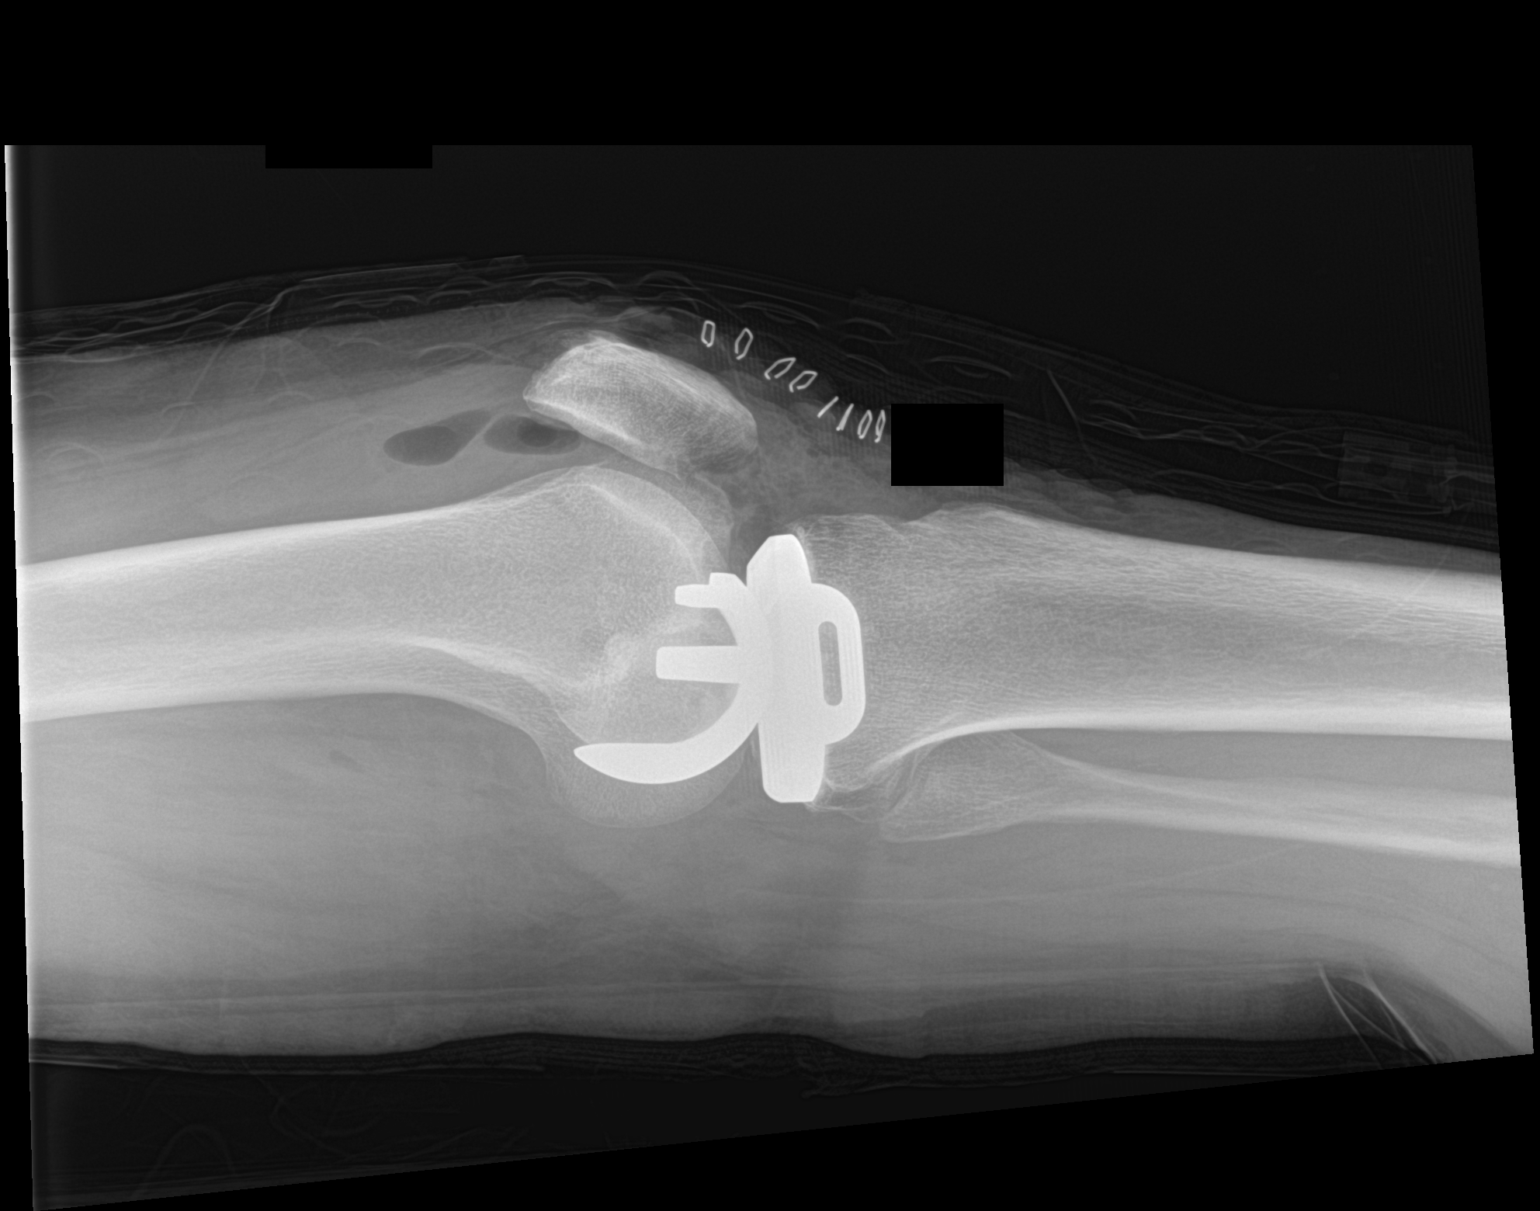

[2 of 2 positions shown; findings below may reference images not displayed]

FINDINGS: AP and cross-table lateral views at 9544 hours. Left medial
compartment unicondylar knee arthroplasty changes. Hardware appears
intact and normally aligned. Small volume postoperative gas and
fluid in the knee joint. Anterior skin staples. No unexpected
osseous changes.
IMPRESSION: Unit condylar left knee arthroplasty with no adverse features.
